# Patient Record
Sex: Male | Born: 1955 | Race: White | Hispanic: No | Marital: Married | Smoking: Never smoker
Health system: Southern US, Community
[De-identification: ages and names within clinical notes are randomized; demographics above are authoritative.]

## PROBLEM LIST (undated history)

## (undated) DIAGNOSIS — M51379 Other intervertebral disc degeneration, lumbosacral region without mention of lumbar back pain or lower extremity pain: Secondary | ICD-10-CM

## (undated) DIAGNOSIS — G4730 Sleep apnea, unspecified: Secondary | ICD-10-CM

## (undated) DIAGNOSIS — M5137 Other intervertebral disc degeneration, lumbosacral region: Secondary | ICD-10-CM

## (undated) DIAGNOSIS — E785 Hyperlipidemia, unspecified: Secondary | ICD-10-CM

## (undated) DIAGNOSIS — I10 Essential (primary) hypertension: Secondary | ICD-10-CM

## (undated) DIAGNOSIS — K760 Fatty (change of) liver, not elsewhere classified: Secondary | ICD-10-CM

## (undated) HISTORY — DX: Sleep apnea, unspecified: G47.30

## (undated) HISTORY — DX: Other intervertebral disc degeneration, lumbosacral region without mention of lumbar back pain or lower extremity pain: M51.379

## (undated) HISTORY — DX: Essential (primary) hypertension: I10

## (undated) HISTORY — DX: Morbid (severe) obesity due to excess calories: E66.01

## (undated) HISTORY — DX: Other intervertebral disc degeneration, lumbosacral region: M51.37

## (undated) HISTORY — DX: Fatty (change of) liver, not elsewhere classified: K76.0

## (undated) HISTORY — PX: OTHER SURGICAL HISTORY: SHX169

## (undated) HISTORY — DX: Hyperlipidemia, unspecified: E78.5

---

## 2016-11-30 ENCOUNTER — Encounter: Payer: Self-pay | Admitting: Family Medicine

## 2017-02-05 HISTORY — PX: SKIN CANCER EXCISION: SHX779

## 2017-07-11 ENCOUNTER — Encounter: Payer: Self-pay | Admitting: *Deleted

## 2017-09-13 ENCOUNTER — Ambulatory Visit: Payer: 59 | Admitting: Family Medicine

## 2017-10-14 ENCOUNTER — Encounter: Payer: Self-pay | Admitting: Family Medicine

## 2017-10-14 ENCOUNTER — Telehealth: Payer: Self-pay | Admitting: Family Medicine

## 2017-10-14 ENCOUNTER — Ambulatory Visit (INDEPENDENT_AMBULATORY_CARE_PROVIDER_SITE_OTHER): Payer: 59 | Admitting: Family Medicine

## 2017-10-14 VITALS — BP 164/100 | HR 98 | Temp 98.3°F | Resp 22 | Ht 73.0 in | Wt 362.0 lb

## 2017-10-14 DIAGNOSIS — Z0000 Encounter for general adult medical examination without abnormal findings: Secondary | ICD-10-CM | POA: Diagnosis not present

## 2017-10-14 DIAGNOSIS — I10 Essential (primary) hypertension: Secondary | ICD-10-CM

## 2017-10-14 DIAGNOSIS — Z1159 Encounter for screening for other viral diseases: Secondary | ICD-10-CM | POA: Diagnosis not present

## 2017-10-14 DIAGNOSIS — Z8249 Family history of ischemic heart disease and other diseases of the circulatory system: Secondary | ICD-10-CM

## 2017-10-14 DIAGNOSIS — G4730 Sleep apnea, unspecified: Secondary | ICD-10-CM

## 2017-10-14 DIAGNOSIS — Z1211 Encounter for screening for malignant neoplasm of colon: Secondary | ICD-10-CM

## 2017-10-14 DIAGNOSIS — Z125 Encounter for screening for malignant neoplasm of prostate: Secondary | ICD-10-CM | POA: Diagnosis not present

## 2017-10-14 DIAGNOSIS — Z7689 Persons encountering health services in other specified circumstances: Secondary | ICD-10-CM

## 2017-10-14 MED ORDER — LOSARTAN POTASSIUM-HCTZ 100-25 MG PO TABS: 1.0000 | ORAL_TABLET | Freq: Every day | ORAL | 3 refills | Status: DC

## 2017-10-14 NOTE — Progress Notes (Signed)
Subjective:    Patient ID: Aaron Duncan, male    DOB: 1955-07-30, 62 y.o.   MRN: [id number]  HPI Patient is a very pleasant 62 year old Caucasian male here today to establish care.  He has 2 major concerns.  First he would like a referral to gastroenterology for a colonoscopy.  He states that his last colonoscopy was when he was 62 years old.  However his brother was found to have 9, "precancerous" polyps on his last colonoscopy and therefore the patient is concerned and would like to have a follow-up colonoscopy.  He is certainly overdue given his age.  His second concern is he wants to be checked for a AAA.  His paternal grandfather had a AAA that required surgical correction.  His father almost died from a ruptured AAA.  Both of these events happened when they were in their 60s.  The patient is a non-smoker but given his family history, he is concerned that he may have a AAA that is going undiagnosed and therefore he would like an ultrasound to evaluate further.  I am concerned by his blood pressure.  His blood pressure today is 164/100.  Patient states that he is extremely nervous in the doctor's office and he feels that this is a fictitious reading.  He states that his blood pressure at home is usually 130-140/80.  He is overdue for hepatitis C screening.  He is overdue for prostate cancer screening.  He is overdue for fasting lab work.  Past medical history significant for obstructive sleep apnea.  He is uncertain of the severity.  This was checked over 20 years ago.  However he never started treatment for it.  He states that he has none of the symptoms of obstructive sleep apnea.  He denies any hypersomnolence.  He denies any fatigue.  He does state that his wife is heard him gasp for air in the middle the night.  On examination today, his neck circumference is greater than 20 inches.  He has a very short mandible.  On examination of his posterior oropharynx, I am unable to appreciate an airway.  He has a  very low palate and also a very large tongue that obscures visualization of the uvula in the posterior oropharynx.  Therefore I believe just based on his physical exam findings he is at high risk for severe obstructive sleep apnea. Past Medical History:  Diagnosis Date  . Hyperlipidemia   . Hypertension   . Morbid obesity (HCC)   . Sleep apnea    Past Surgical History:  Procedure Laterality Date  . right elbow tendonitis     work related injury   Current Outpatient Medications on File Prior to Visit  Medication Sig Dispense Refill  . Ginger, Zingiber officinalis, (GINGER PO) Take by mouth.    . magnesium 30 MG tablet Take 30 mg by mouth 2 (two) times daily.    . Multiple Vitamin (MULTIVITAMIN WITH MINERALS) TABS tablet Take 1 tablet by mouth daily.    . TURMERIC PO Take by mouth.    . vitamin B-12 (CYANOCOBALAMIN) 100 MCG tablet Take 100 mcg by mouth daily.     No current facility-administered medications on file prior to visit.    No Known Allergies Social History   Socioeconomic History  . Marital status: Married    Spouse name: Not on file  . Number of children: Not on file  . Years of education: Not on file  . Highest education level: Not on file  Occupational History  . Not on file  Social Needs  . Financial resource strain: Not on file  . Food insecurity:    Worry: Not on file    Inability: Not on file  . Transportation needs:    Medical: Not on file    Non-medical: Not on file  Tobacco Use  . Smoking status: Never Smoker  . Smokeless tobacco: Never Used  Substance and Sexual Activity  . Alcohol use: Yes    Alcohol/week: 2.0 standard drinks    Types: 2 Glasses of wine per week    Frequency: Never  . Drug use: Never  . Sexual activity: Not Currently  Lifestyle  . Physical activity:    Days per week: Not on file    Minutes per session: Not on file  . Stress: Not on file  Relationships  . Social connections:    Talks on phone: Not on file    Gets  together: Not on file    Attends religious service: Not on file    Active member of club or organization: Not on file    Attends meetings of clubs or organizations: Not on file    Relationship status: Not on file  . Intimate partner violence:    Fear of current or ex partner: Not on file    Emotionally abused: Not on file    Physically abused: Not on file    Forced sexual activity: Not on file  Other Topics Concern  . Not on file  Social History Narrative  . Not on file  Patient is a retired Financial controller Family History  Problem Relation Age of Onset  . Diabetes Mother   . AAA (abdominal aortic aneurysm) Father   . Cancer Father        lung  . Diabetes Maternal Uncle   . AAA (abdominal aortic aneurysm) Paternal Grandfather       Review of Systems  All other systems reviewed and are negative.      Objective:   Physical Exam  Constitutional: He is oriented to person, place, and time. He appears well-developed and well-nourished. No distress.  HENT:  Head: Normocephalic and atraumatic.  Right Ear: External ear normal.  Left Ear: External ear normal.  Nose: Nose normal.  Mouth/Throat: Oropharynx is clear and moist. No oropharyngeal exudate.  Eyes: Pupils are equal, round, and reactive to light. Conjunctivae and EOM are normal. Right eye exhibits no discharge. Left eye exhibits no discharge. No scleral icterus.  Neck: Normal range of motion. Neck supple. No JVD present. No tracheal deviation present. No thyromegaly present.  Cardiovascular: Normal rate, regular rhythm, normal heart sounds and intact distal pulses. Exam reveals no gallop and no friction rub.  No murmur heard. Pulmonary/Chest: Effort normal and breath sounds normal. No stridor. No respiratory distress. He has no wheezes. He has no rales. He exhibits no tenderness.  Abdominal: Soft. Bowel sounds are normal. He exhibits no distension and no mass. There is no tenderness. There is no rebound and no guarding. No  hernia.  Musculoskeletal: He exhibits no edema, tenderness or deformity.  Lymphadenopathy:    He has no cervical adenopathy.  Neurological: He is alert and oriented to person, place, and time. He displays normal reflexes. No cranial nerve deficit or sensory deficit. He exhibits normal muscle tone. Coordination normal.  Skin: Skin is warm. No rash noted. He is not diaphoretic. No erythema. No pallor.  Psychiatric: He has a normal mood and affect. His behavior is normal. Judgment  and thought content normal.  Vitals reviewed.         Assessment & Plan:  Encounter to establish care with new doctor - Plan: CBC with Differential/Platelet, COMPLETE METABOLIC PANEL WITH GFR, Lipid panel, PSA, Hepatitis C Antibody  Benign essential HTN - Plan: CBC with Differential/Platelet, COMPLETE METABOLIC PANEL WITH GFR, Lipid panel  Prostate cancer screening - Plan: PSA  Encounter for hepatitis C screening test for low risk patient - Plan: Hepatitis C Antibody  Sleep apnea, unspecified type  Colon cancer screening  Family history of abdominal aortic aneurysm (AAA)  Morbid obesity (HCC)  There is several issues that we discussed today.  First I will gladly schedule the patient for a colonoscopy to update his colon cancer screening.  In addition to this I will check a PSA to screen for prostate cancer as well as I will screen the patient for hepatitis C.  I am very concerned about his blood pressure.  Of asked the patient to bring by his blood pressure cuff so that we can ensure that it is accurate but checking it against ours.  I asked him to check his blood pressure more frequently at home and I would definitely treat his blood pressure if his blood pressures consistently greater than 140/90.  At the present time I will assume whitecoat syndrome until we have verified or refuted the accuracy of his blood pressure cuff.  I will check a CBC, CMP, and fasting lipid panel.  I will schedule patient for an  ultrasound of the abdomen to screen for AAA given his family history.  Mended a referral to a sleep specialist to screen the patient for obstructive sleep apnea is based on his physical exam findings, I am very concerned that he likely has that disease and that is being inadequately treated at the present time

## 2017-10-14 NOTE — Telephone Encounter (Signed)
Medication called/sent to requested pharmacy  

## 2017-10-14 NOTE — Telephone Encounter (Signed)
[facility]

## 2017-10-15 LAB — LIPID PANEL
Cholesterol: 197 mg/dL (ref <200)
HDL: 35 mg/dL — ABNORMAL LOW (ref >40)
LDL CHOLESTEROL (CALC): 135 mg/dL — AB
NON-HDL CHOLESTEROL (CALC): 162 mg/dL — AB (ref <130)
TRIGLYCERIDES: 141 mg/dL (ref <150)
Total CHOL/HDL Ratio: 5.6 (calc) — ABNORMAL HIGH (ref <5.0)

## 2017-10-15 LAB — CBC WITH DIFFERENTIAL/PLATELET
Basophils Absolute: 41 cells/uL (ref 0–200)
Basophils Relative: 0.5 %
EOS ABS: 178 {cells}/uL (ref 15–500)
Eosinophils Relative: 2.2 %
HCT: 51.5 % — ABNORMAL HIGH (ref 38.5–50.0)
Hemoglobin: 17.7 g/dL — ABNORMAL HIGH (ref 13.2–17.1)
Lymphs Abs: 2284 cells/uL (ref 850–3900)
MCH: 31.1 pg (ref 27.0–33.0)
MCHC: 34.4 g/dL (ref 32.0–36.0)
MCV: 90.4 fL (ref 80.0–100.0)
MPV: 11.1 fL (ref 7.5–12.5)
Monocytes Relative: 10.8 %
NEUTROS PCT: 58.3 %
Neutro Abs: 4722 cells/uL (ref 1500–7800)
PLATELETS: 234 10*3/uL (ref 140–400)
RBC: 5.7 10*6/uL (ref 4.20–5.80)
RDW: 13 % (ref 11.0–15.0)
TOTAL LYMPHOCYTE: 28.2 %
WBC: 8.1 10*3/uL (ref 3.8–10.8)
WBCMIX: 875 {cells}/uL (ref 200–950)

## 2017-10-15 LAB — COMPLETE METABOLIC PANEL WITH GFR
AG RATIO: 1.6 (calc) (ref 1.0–2.5)
ALT: 45 U/L (ref 9–46)
AST: 41 U/L — AB (ref 10–35)
Albumin: 4.5 g/dL (ref 3.6–5.1)
Alkaline phosphatase (APISO): 83 U/L (ref 40–115)
BILIRUBIN TOTAL: 0.8 mg/dL (ref 0.2–1.2)
BUN: 11 mg/dL (ref 7–25)
CHLORIDE: 101 mmol/L (ref 98–110)
CO2: 24 mmol/L (ref 20–32)
Calcium: 10.1 mg/dL (ref 8.6–10.3)
Creat: 1.11 mg/dL (ref 0.70–1.25)
GFR, EST AFRICAN AMERICAN: 82 mL/min/{1.73_m2} (ref >=60)
GFR, EST NON AFRICAN AMERICAN: 71 mL/min/{1.73_m2} (ref >=60)
Globulin: 2.8 g/dL (calc) (ref 1.9–3.7)
Glucose, Bld: 99 mg/dL (ref 65–99)
Potassium: 4.3 mmol/L (ref 3.5–5.3)
Sodium: 137 mmol/L (ref 135–146)
Total Protein: 7.3 g/dL (ref 6.1–8.1)

## 2017-10-15 LAB — HEPATITIS C ANTIBODY
Hepatitis C Ab: NONREACTIVE
SIGNAL TO CUT-OFF: 0.18 (ref <1.00)

## 2017-10-15 LAB — PSA: PSA: 1 ng/mL (ref <=4.0)

## 2017-10-18 ENCOUNTER — Other Ambulatory Visit: Payer: Self-pay | Admitting: Family Medicine

## 2017-10-18 MED ORDER — ATORVASTATIN CALCIUM 20 MG PO TABS: 20.0000 mg | ORAL_TABLET | Freq: Every day | ORAL | 1 refills | Status: DC

## 2017-10-23 ENCOUNTER — Ambulatory Visit
Admission: RE | Admit: 2017-10-23 | Discharge: 2017-10-23 | Disposition: A | Discharge status: Home / Self Care | Payer: 59 | Source: Ambulatory Visit | Attending: Family Medicine | Admitting: Family Medicine

## 2017-10-23 DIAGNOSIS — Z8249 Family history of ischemic heart disease and other diseases of the circulatory system: Secondary | ICD-10-CM

## 2017-10-24 ENCOUNTER — Other Ambulatory Visit: Payer: Self-pay | Admitting: Family Medicine

## 2017-10-24 DIAGNOSIS — Z8249 Family history of ischemic heart disease and other diseases of the circulatory system: Secondary | ICD-10-CM

## 2017-10-24 NOTE — Progress Notes (Signed)
CT ordered per Dr.Pickard. Please see result note for Ultrasound aorta complete

## 2017-10-31 ENCOUNTER — Encounter: Payer: Self-pay | Admitting: Gastroenterology

## 2017-10-31 ENCOUNTER — Ambulatory Visit
Admission: RE | Admit: 2017-10-31 | Discharge: 2017-10-31 | Disposition: A | Discharge status: Home / Self Care | Payer: 59 | Source: Ambulatory Visit | Attending: Family Medicine | Admitting: Family Medicine

## 2017-10-31 DIAGNOSIS — Z8249 Family history of ischemic heart disease and other diseases of the circulatory system: Secondary | ICD-10-CM

## 2017-11-04 ENCOUNTER — Encounter: Payer: Self-pay | Admitting: Family Medicine

## 2017-11-04 DIAGNOSIS — M5137 Other intervertebral disc degeneration, lumbosacral region: Secondary | ICD-10-CM | POA: Insufficient documentation

## 2017-11-05 ENCOUNTER — Encounter: Payer: Self-pay | Admitting: *Deleted

## 2017-11-26 ENCOUNTER — Telehealth: Payer: Self-pay

## 2017-11-26 ENCOUNTER — Other Ambulatory Visit: Payer: Self-pay

## 2017-11-26 DIAGNOSIS — Z1211 Encounter for screening for malignant neoplasm of colon: Secondary | ICD-10-CM

## 2017-11-26 NOTE — Telephone Encounter (Signed)
Aaron Duncan, Aaron Duncan is scheduled for PV on 12/02/17 and a colonoscopy with Dr. Lavon Paganini on 12/20/17. Patients weight is 362 and his BMI is 47.8. Dr. Lavon Paganini said that it is ok to schedule him for a direct hospital. He doesn't seem to have any other health issues. If his appointment is going to be several weeks away, we will need to reschedule PV closer to the time of the procedure. Thanks!  Janalee Dane, LPN ( PV )

## 2017-11-26 NOTE — Telephone Encounter (Signed)
Left message for Aaron Duncan to call back to me. His new colonoscopy date is 01/27/18 at 8:30 am. I have cancelled the [facility] and pre-visit appointments.

## 2017-11-26 NOTE — Telephone Encounter (Signed)
Dr. Lavon Paganini,   Aaron Duncan is a direct colon screening. His BMI is 47.8 and his weight is 362. Would you like for him to have an OV or a direct hospital colonoscopy? Please advise. Thanks!  Janalee Dane, LPN ( PV )

## 2017-11-26 NOTE — Telephone Encounter (Signed)
Okay to schedule as direct screening colonoscopy at hospital, next available appointment. Thanks

## 2017-11-28 NOTE — Telephone Encounter (Signed)
Left message on the spouse cell phone requesting call back.

## 2017-11-29 NOTE — Telephone Encounter (Signed)
  Contacted the spouse. Rescheduled the pre-visit. Agrees to the hospital procedure date of 01/27/18

## 2017-11-29 NOTE — Telephone Encounter (Signed)
The patient is now calling back stating he does not want the procedure during the week of Christmas and wants to reschedule that appt even if it has to be next year per pt.

## 2017-12-02 ENCOUNTER — Telehealth: Payer: Self-pay | Admitting: Gastroenterology

## 2017-12-02 NOTE — Telephone Encounter (Signed)
Left message for Ms. Hoefle. Colonoscopy at the [facility] and the pre-visit have been cancelled. Will put in for a recall after 02/05/2018

## 2017-12-03 NOTE — Telephone Encounter (Signed)
Called to the spouse. Left message that I will cancel the appointment for the colonoscopy as requested. Will reach out to him when the new year's schedule is released.

## 2017-12-10 ENCOUNTER — Encounter: Payer: 59 | Admitting: Gastroenterology

## 2017-12-24 ENCOUNTER — Institutional Professional Consult (permissible substitution): Payer: 59 | Admitting: Neurology

## 2018-01-27 ENCOUNTER — Encounter (HOSPITAL_COMMUNITY): Payer: Self-pay

## 2018-01-27 ENCOUNTER — Ambulatory Visit (HOSPITAL_COMMUNITY): Admit: 2018-01-27 | Payer: 59 | Admitting: Gastroenterology

## 2018-01-27 SURGERY — COLONOSCOPY WITH PROPOFOL
Anesthesia: Monitor Anesthesia Care

## 2018-02-27 NOTE — Telephone Encounter (Signed)
Pt's wife Melody called looking to schedule pt's colon at hosp. Pls call her.

## 2018-02-28 NOTE — Telephone Encounter (Signed)
Left message that I am looking for an opening on the hospital schedule. Presently there are not any openings. March schedule is not released. February schedule is filled.

## 2018-03-04 ENCOUNTER — Telehealth: Payer: Self-pay | Admitting: Gastroenterology

## 2018-03-04 NOTE — Telephone Encounter (Signed)
Pt wife called in and advised that the sched proc for feb.3 2020 can be released.

## 2018-03-04 NOTE — Telephone Encounter (Signed)
Left a message to call asap.  Opening for 03/10/2018 at [facility] for colonoscopy.  Would need pre-visit Thursday.

## 2018-03-05 NOTE — Telephone Encounter (Signed)
Called to the patient. He declines the appointment because he will be out of town. Willing to wait for thew March schedule.

## 2018-03-07 ENCOUNTER — Telehealth: Payer: Self-pay | Admitting: Gastroenterology

## 2018-03-07 NOTE — Telephone Encounter (Signed)
Case is cancelled. Left message for Onalee Hua.

## 2018-03-07 NOTE — Telephone Encounter (Signed)
Aaron Duncan from [employer] called and stated that the pt appt that is sched for 03/10/2018 is still showing on his end as non sched. He is wanting someone to follow up with him regarding the appt.

## 2018-03-10 ENCOUNTER — Encounter (HOSPITAL_COMMUNITY): Payer: Self-pay

## 2018-03-10 ENCOUNTER — Ambulatory Visit (HOSPITAL_COMMUNITY): Admit: 2018-03-10 | Payer: 59 | Admitting: Gastroenterology

## 2018-03-10 SURGERY — COLONOSCOPY WITH PROPOFOL
Anesthesia: Monitor Anesthesia Care

## 2018-03-18 ENCOUNTER — Encounter: Payer: Self-pay | Admitting: Gastroenterology

## 2018-04-16 ENCOUNTER — Telehealth: Payer: Self-pay | Admitting: Gastroenterology

## 2018-04-16 ENCOUNTER — Other Ambulatory Visit: Payer: Self-pay

## 2018-04-16 DIAGNOSIS — Z1211 Encounter for screening for malignant neoplasm of colon: Secondary | ICD-10-CM

## 2018-04-16 MED ORDER — SUPREP BOWEL PREP KIT 17.5-3.13-1.6 GM/177ML PO SOLN: ORAL | 0 refills | Status: DC

## 2018-04-16 NOTE — Telephone Encounter (Signed)
Scheduled for 05/26/18. Patient accepts this appointment. Instructions mailed. He will call with his questions or concerns. Rx mailed also.

## 2018-04-16 NOTE — Telephone Encounter (Signed)
Pt called to schedule colonoscopy at hospital.

## 2018-05-09 ENCOUNTER — Telehealth: Payer: Self-pay

## 2018-05-09 NOTE — Telephone Encounter (Signed)
Contacted the patient.  Explained the plan. Procedures canceled.

## 2018-05-09 NOTE — Telephone Encounter (Signed)
-----   Message from Napoleon Form, MD sent at 05/07/2018 11:50 AM EDT ----- Patient is scheduled at [facility] endo 4/20. Will need to reschedule for later.  He was originally scheduled last year, since then [facility] criteria have changed and no longer take only weight in consideration. If his BMI is below 50, he can be moved for [facility] with RN previsit.  Thanks VN

## 2018-05-26 ENCOUNTER — Ambulatory Visit (HOSPITAL_COMMUNITY): Admission: RE | Admit: 2018-05-26 | Payer: 59 | Source: Home / Self Care | Admitting: Gastroenterology

## 2018-05-26 ENCOUNTER — Encounter (HOSPITAL_COMMUNITY): Admission: RE | Payer: Self-pay | Source: Home / Self Care

## 2018-05-26 SURGERY — COLONOSCOPY WITH PROPOFOL
Anesthesia: Monitor Anesthesia Care

## 2018-06-12 ENCOUNTER — Other Ambulatory Visit: Payer: Self-pay

## 2018-06-12 DIAGNOSIS — Z1211 Encounter for screening for malignant neoplasm of colon: Secondary | ICD-10-CM

## 2018-06-25 ENCOUNTER — Telehealth: Payer: Self-pay | Admitting: *Deleted

## 2018-06-25 NOTE — Telephone Encounter (Signed)
McKew, Austin Miles, LPN  Inocente Salles, RN        Sure can!  Do I need to schedule the [facility] appointment or call the patient?   Previous Messages    ----- Message -----  From: Inocente Salles, RN  Sent: 06/25/2018  9:56 AM EDT  To: Evalee Jefferson, LPN   Hey,  Could you cancel this pt's appointment for 07-07-18 at [facility]?   Thanks,  Baxter Hire  ----- Message -----  From: Napoleon Form, MD  Sent: 06/25/2018  9:27 AM EDT  To: Inocente Salles, RN   Agree we can schedule him for [facility] with change in weight restriction  ----- Message -----  From: Inocente Salles, RN  Sent: 06/25/2018  8:49 AM EDT  To: Evalee Jefferson, LPN, Napoleon Form, MD   Hey,   I'm just getting this pt's chart ready for his PV on 07-01-18. Since his last recorded BMI is 47.7, could he be done at the [facility]? Is there any other reason he needs to be done at the hospital?   Baxter Hire

## 2018-07-01 ENCOUNTER — Encounter: Payer: Self-pay | Admitting: Gastroenterology

## 2018-07-01 ENCOUNTER — Other Ambulatory Visit: Payer: Self-pay

## 2018-07-01 ENCOUNTER — Ambulatory Visit (AMBULATORY_SURGERY_CENTER): Payer: Self-pay

## 2018-07-01 VITALS — Ht 73.0 in | Wt 362.0 lb

## 2018-07-01 DIAGNOSIS — Z1211 Encounter for screening for malignant neoplasm of colon: Secondary | ICD-10-CM

## 2018-07-01 MED ORDER — NA SULFATE-K SULFATE-MG SULF 17.5-3.13-1.6 GM/177ML PO SOLN: 1.0000 | Freq: Once | ORAL | 0 refills | Status: AC

## 2018-07-01 NOTE — Progress Notes (Signed)
Denies allergies to eggs or soy products. Denies complication of anesthesia or sedation. Denies use of weight loss medication. Denies use of O2.   Emmi instructions given for colonoscopy.  Pre-Visit was conducted by phone due to Covid 19. Instructions were reviewed and mailed to patients confirmed home address. Patient was encouraged to call if he had any questions or concerns.

## 2018-07-07 ENCOUNTER — Encounter (HOSPITAL_COMMUNITY): Payer: Self-pay

## 2018-07-07 ENCOUNTER — Ambulatory Visit (HOSPITAL_COMMUNITY): Admit: 2018-07-07 | Payer: 59 | Admitting: Gastroenterology

## 2018-07-07 ENCOUNTER — Telehealth: Payer: Self-pay

## 2018-07-07 SURGERY — COLONOSCOPY WITH PROPOFOL
Anesthesia: Monitor Anesthesia Care

## 2018-07-07 NOTE — Telephone Encounter (Signed)
Left message

## 2018-07-08 ENCOUNTER — Telehealth: Payer: Self-pay | Admitting: *Deleted

## 2018-07-08 NOTE — Telephone Encounter (Signed)
Covid-19 screening questions  Have you traveled in the last 14 days?no  If yes where?  Do you now or have you had a fever in the last 14 days?no  Do you have any respiratory symptoms of shortness of breath or cough now or in the last 14 days?no  Do you have any family members or close contacts with diagnosed or suspected Covid-19 in the past 14 days?no  Have you been tested for Covid-19 and found to be positive?no   Pt made aware of care partner policy. SM       

## 2018-07-09 ENCOUNTER — Ambulatory Visit (AMBULATORY_SURGERY_CENTER): Payer: 59 | Admitting: Gastroenterology

## 2018-07-09 ENCOUNTER — Encounter: Payer: Self-pay | Admitting: Gastroenterology

## 2018-07-09 ENCOUNTER — Other Ambulatory Visit: Payer: Self-pay

## 2018-07-09 VITALS — BP 144/76 | HR 74 | Temp 98.3°F | Resp 26 | Ht 73.0 in | Wt 362.0 lb

## 2018-07-09 DIAGNOSIS — Z538 Procedure and treatment not carried out for other reasons: Secondary | ICD-10-CM | POA: Diagnosis not present

## 2018-07-09 DIAGNOSIS — Z1211 Encounter for screening for malignant neoplasm of colon: Secondary | ICD-10-CM | POA: Diagnosis not present

## 2018-07-09 MED ORDER — SODIUM CHLORIDE 0.9 % IV SOLN: 500.0000 mL | Freq: Once | INTRAVENOUS | Status: DC

## 2018-07-09 NOTE — Progress Notes (Signed)
Dr Lavon Paganini asked me not to give anymore propofol after initial dose d/t bad prep and concerns over pt airway. Pt sats were 90-95% on 3L [place] pre procedure. He has significant obstruction with sedation that was overcome with jaw thrust but sats remained 88-90% on 5L.   To PACU, VSS Report to Rn.tb

## 2018-07-09 NOTE — Progress Notes (Signed)
History reviewed today  Temp and Covid 19 questions per C. Washington, VS per J. Branson 

## 2018-07-09 NOTE — Op Note (Signed)
[facility] Patient Name: Janean SarkDale Wenberg Procedure Date: 07/09/2018 10:10 AM MRN: [id number] Endoscopist: Napoleon FormKavitha V. Nandigam , MD Age: [age] Referring MD:  Date of Birth: 10/27/1955 Gender: Male Account #: [id number] Procedure:                Colonoscopy Indications:              Screening for colorectal malignant neoplasm Medicines:                Monitored Anesthesia Care Procedure:                Pre-Anesthesia Assessment:                           - Prior to the procedure, a History and Physical                            was performed, and patient medications and                            allergies were reviewed. The patient's tolerance of                            previous anesthesia was also reviewed. The risks                            and benefits of the procedure and the sedation                            options and risks were discussed with the patient.                            All questions were answered, and informed consent                            was obtained. Prior Anticoagulants: The patient has                            taken no previous anticoagulant or antiplatelet                            agents. ASA Grade Assessment: III - A patient with                            severe systemic disease. After reviewing the risks                            and benefits, the patient was deemed in                            satisfactory condition to undergo the procedure.                           After obtaining informed consent, the colonoscope  was passed under direct vision. Throughout the                            procedure, the patient's blood pressure, pulse, and                            oxygen saturations were monitored continuously. The                            Colonoscope was introduced through the anus and                            advanced to the the cecum, identified by                            appendiceal orifice and  ileocecal valve. The                            colonoscopy was performed without difficulty. The                            patient tolerated the procedure well. The quality                            of the bowel preparation was poor. The ileocecal                            valve, appendiceal orifice, and rectum were                            photographed. Scope In: 10:37:25 AM Scope Out: 10:44:27 AM Scope Withdrawal Time: 0 hours 2 minutes 42 seconds  Total Procedure Duration: 0 hours 7 minutes 2 seconds  Findings:                 The perianal and digital rectal examinations were                            normal.                           A moderate amount of semi-liquid semi-solid stool                            was found in the entire colon, interfering with                            visualization.                           Multiple small and large-mouthed diverticula were                            found in the sigmoid colon and descending colon.  Non-bleeding internal hemorrhoids were found during                            retroflexion. The hemorrhoids were medium-sized. Complications:            No immediate complications. Impression:               - Preparation of the colon was poor.                           - Stool in the entire examined colon.                           - Moderate diverticulosis in the sigmoid colon and                            in the descending colon.                           - Non-bleeding internal hemorrhoids.                           - No specimens collected. Recommendation:           - Patient has a contact number available for                            emergencies. The signs and symptoms of potential                            delayed complications were discussed with the                            patient. Return to normal activities tomorrow.                            Written discharge instructions were provided to  the                            patient.                           - Resume previous diet.                           - Continue present medications.                           - Repeat colonoscopy with extended bowel prep at                            the next available appointment because the bowel                            preparation was suboptimal. Will need to schedule  procedure at [facility] endoscopy unit , O2 sat in 80's on                            5L [place], OSA. Napoleon Form, MD 07/09/2018 10:51:10 AM This report has been signed electronically.

## 2018-07-09 NOTE — Patient Instructions (Signed)
Please read handouts provided. Continue present medications.     YOU HAD AN ENDOSCOPIC PROCEDURE TODAY AT THE [facility]:   Refer to the procedure report that was given to you for any specific questions about what was found during the examination.  If the procedure report does not answer your questions, please call your gastroenterologist to clarify.  If you requested that your care partner not be given the details of your procedure findings, then the procedure report has been included in a sealed envelope for you to review at your convenience later.  YOU SHOULD EXPECT: Some feelings of bloating in the abdomen. Passage of more gas than usual.  Walking can help get rid of the air that was put into your GI tract during the procedure and reduce the bloating. If you had a lower endoscopy (such as a colonoscopy or flexible sigmoidoscopy) you may notice spotting of blood in your stool or on the toilet paper. If you underwent a bowel prep for your procedure, you may not have a normal bowel movement for a few days.  Please Note:  You might notice some irritation and congestion in your nose or some drainage.  This is from the oxygen used during your procedure.  There is no need for concern and it should clear up in a day or so.  SYMPTOMS TO REPORT IMMEDIATELY:   Following lower endoscopy (colonoscopy or flexible sigmoidoscopy):  Excessive amounts of blood in the stool  Significant tenderness or worsening of abdominal pains  Swelling of the abdomen that is new, acute  Fever of 100F or higher   For urgent or emergent issues, a gastroenterologist can be reached at any hour by calling [phone].   DIET:  We do recommend a small meal at first, but then you may proceed to your regular diet.  Drink plenty of fluids but you should avoid alcoholic beverages for 24 hours.  ACTIVITY:  You should plan to take it easy for the rest of today and you should NOT DRIVE or use heavy machinery  until tomorrow (because of the sedation medicines used during the test).    FOLLOW UP: Our staff will call the number listed on your records 48-72 hours following your procedure to check on you and address any questions or concerns that you may have regarding the information given to you following your procedure. If we do not reach you, we will leave a message.  We will attempt to reach you two times.  During this call, we will ask if you have developed any symptoms of COVID 19. If you develop any symptoms (ie: fever, flu-like symptoms, shortness of breath, cough etc.) before then, please call [phone].  If you test positive for Covid 19 in the 2 weeks post procedure, please call and report this information to us.    If any biopsies were taken you will be contacted by phone or by letter within the next 1-3 weeks.  Please call us at [phone] if you have not heard about the biopsies in 3 weeks.    SIGNATURES/CONFIDENTIALITY: You and/or your care partner have signed paperwork which will be entered into your electronic medical record.  These signatures attest to the fact that that the information above on your After Visit Summary has been reviewed and is understood.  Full responsibility of the confidentiality of this discharge information lies with you and/or your care-partner. 

## 2018-07-11 ENCOUNTER — Telehealth: Payer: Self-pay

## 2018-07-11 NOTE — Telephone Encounter (Signed)
Attempted to reach patient for post-procedure f/u call. No answer. Left message that we will make another attempt to reach him again later today and for him to please not hesitate to call us if he has any questions/concerns regarding his care. 

## 2018-07-11 NOTE — Telephone Encounter (Signed)
Patient had sub-optimal prep for his colonoscopy on 07/09/18. He is aware of the recommendation for a repeat procedure. See the recommendation in the report. Called the patient to discuss scheduling. He states he chooses to wait until after the face mask requirement has been lifted. He specifically declines the offer of 08/27/2018 at [facility]. States he will call us as soon as he is ready and comfortable with having the procedure repeated.

## 2018-07-11 NOTE — Telephone Encounter (Signed)
  Follow up Call-  Call back number 07/09/2018  Post procedure Call Back phone  # [phone]  Permission to leave phone message Yes     Patient questions:  Do you have a fever, pain , or abdominal swelling? No. Pain Score  0 *  Have you tolerated food without any problems? Yes.    Have you been able to return to your normal activities? Yes.    Do you have any questions about your discharge instructions: Diet   No. Medications  No. Follow up visit  No.  Do you have questions or concerns about your Care? No.  Actions: * If pain score is 4 or above: No action needed, pain <4.  1. Have you developed a fever since your procedure? no  2.   Have you had an respiratory symptoms (SOB or cough) since your procedure? no  3.   Have you tested positive for COVID 19 since your procedure no  4.   Have you had any family members/close contacts diagnosed with the COVID 19 since your procedure?  no   If yes to any of these questions please route to Laverna Peace, RN and Jennye Boroughs, Charity fundraiser.

## 2018-08-27 ENCOUNTER — Ambulatory Visit (HOSPITAL_COMMUNITY): Admit: 2018-08-27 | Payer: 59 | Admitting: Gastroenterology

## 2018-08-27 ENCOUNTER — Encounter (HOSPITAL_COMMUNITY): Payer: Self-pay

## 2018-08-27 SURGERY — COLONOSCOPY WITH PROPOFOL
Anesthesia: Monitor Anesthesia Care

## 2018-09-02 ENCOUNTER — Other Ambulatory Visit: Payer: Self-pay | Admitting: Family Medicine

## 2018-09-27 ENCOUNTER — Encounter: Payer: Self-pay | Admitting: Gastroenterology

## 2019-02-26 ENCOUNTER — Other Ambulatory Visit: Payer: Self-pay | Admitting: Family Medicine

## 2019-04-21 ENCOUNTER — Other Ambulatory Visit: Payer: Self-pay | Admitting: Family Medicine

## 2019-05-14 ENCOUNTER — Other Ambulatory Visit: Payer: Self-pay | Admitting: Family Medicine

## 2019-05-14 MED ORDER — LOSARTAN POTASSIUM-HCTZ 100-25 MG PO TABS: ORAL_TABLET | ORAL | 11 refills | Status: DC

## 2019-07-03 ENCOUNTER — Telehealth: Payer: Self-pay | Admitting: Family Medicine

## 2019-07-03 NOTE — Telephone Encounter (Signed)
#  CB [id number] Would like to have Losartan-hydrochlorthiazide supply order for 90 days instead of  30 days when time for a refill

## 2020-03-24 ENCOUNTER — Telehealth: Payer: Self-pay | Admitting: *Deleted

## 2020-03-24 NOTE — Telephone Encounter (Signed)
Received VM from patient.   Requested refill on Losartan HCTZ to mail order.   Patient has not been seen since 2019 and will require OV.   Call placed to patient. LMTRC.

## 2020-03-28 MED ORDER — LOSARTAN POTASSIUM-HCTZ 100-25 MG PO TABS: ORAL_TABLET | ORAL | 0 refills | Status: DC

## 2020-03-28 NOTE — Telephone Encounter (Signed)
Call placed to patient and patient made aware.   Appointment scheduled.   Prescription sent to pharmacy.  

## 2020-04-18 ENCOUNTER — Encounter: Payer: 59 | Admitting: Family Medicine

## 2020-05-17 ENCOUNTER — Encounter: Payer: 59 | Admitting: Family Medicine

## 2020-05-30 ENCOUNTER — Other Ambulatory Visit: Payer: 59

## 2020-06-02 ENCOUNTER — Encounter: Payer: Self-pay | Admitting: Family Medicine

## 2020-06-02 ENCOUNTER — Ambulatory Visit (INDEPENDENT_AMBULATORY_CARE_PROVIDER_SITE_OTHER): Payer: 59 | Admitting: Family Medicine

## 2020-06-02 ENCOUNTER — Other Ambulatory Visit: Payer: Self-pay

## 2020-06-02 DIAGNOSIS — Z0001 Encounter for general adult medical examination with abnormal findings: Secondary | ICD-10-CM | POA: Diagnosis not present

## 2020-06-02 DIAGNOSIS — Z125 Encounter for screening for malignant neoplasm of prostate: Secondary | ICD-10-CM

## 2020-06-02 DIAGNOSIS — E7800 Pure hypercholesterolemia, unspecified: Secondary | ICD-10-CM

## 2020-06-02 DIAGNOSIS — Z0000 Encounter for general adult medical examination without abnormal findings: Secondary | ICD-10-CM

## 2020-06-02 DIAGNOSIS — M5137 Other intervertebral disc degeneration, lumbosacral region: Secondary | ICD-10-CM | POA: Diagnosis not present

## 2020-06-02 DIAGNOSIS — G4730 Sleep apnea, unspecified: Secondary | ICD-10-CM | POA: Diagnosis not present

## 2020-06-02 DIAGNOSIS — K760 Fatty (change of) liver, not elsewhere classified: Secondary | ICD-10-CM

## 2020-06-02 NOTE — Progress Notes (Signed)
Subjective:    Patient ID: Aaron Duncan, male    DOB: [birth year], 65 y.o.   MRN: [id number]  HPI  Patient is here today for a complete physical exam.  Patient has morbid obesity with a BMI of 47.  I reviewed his colonoscopy from 2020.  They were unable to get a good view of the colon due to inadequate prep.  Of note, his oxygen saturations were in the 80s on 5 L.  He is most likely dealing with severe obstructive sleep apnea.  He tends to minimize this and wants to avoid any intervention.  He mentions losing weight which I certainly encouraged however I explained to the patient that this will take a long period of time and his blood pressure is extremely high today and I feel that he is likely dealing with severe obstructive sleep apnea.  The patient tends to minimize the seriousness of this issue.  He also has chronic venous stasis changes on both legs with numerous varicose veins per tickly worse on the left shin.  Past Medical History:  Diagnosis Date  . DDD (degenerative disc disease), lumbosacral   . Hepatic steatosis   . Hyperlipidemia   . Hypertension   . Morbid obesity (HCC)   . Sleep apnea    Past Surgical History:  Procedure Laterality Date  . right elbow tendonitis     work related injury  . SKIN CANCER EXCISION Right 2019   basal cell ca R side of face   Current Outpatient Medications on File Prior to Visit  Medication Sig Dispense Refill  . atorvastatin (LIPITOR) 20 MG tablet Take 1 tablet (20 mg total) by mouth daily. 90 tablet 1  . Ginger, Zingiber officinalis, (GINGER PO) Take by mouth.    . losartan-hydrochlorothiazide (HYZAAR) 100-25 MG tablet TAKE 1 TABLET DAILY (NEED OFFICE VISIT AND LABS FOR FURTHER REFILLS) 90 tablet 0  . magnesium 30 MG tablet Take 30 mg by mouth 2 (two) times daily.    . Multiple Vitamin (MULTIVITAMIN WITH MINERALS) TABS tablet Take 1 tablet by mouth daily.    . TURMERIC PO Take by mouth.    . vitamin B-12 (CYANOCOBALAMIN) 100 MCG tablet Take 100  mcg by mouth daily.     No current facility-administered medications on file prior to visit.   No Known Allergies Social History   Socioeconomic History  . Marital status: Married    Spouse name: Not on file  . Number of children: Not on file  . Years of education: Not on file  . Highest education level: Not on file  Occupational History  . Not on file  Tobacco Use  . Smoking status: Never Smoker  . Smokeless tobacco: Never Used  Vaping Use  . Vaping Use: Never used  Substance and Sexual Activity  . Alcohol use: Yes    Alcohol/week: 2.0 standard drinks    Types: 2 Glasses of wine per week  . Drug use: Never  . Sexual activity: Not Currently  Other Topics Concern  . Not on file  Social History Narrative  . Not on file   Social Determinants of Health   Financial Resource Strain: Not on file  Food Insecurity: Not on file  Transportation Needs: Not on file  Physical Activity: Not on file  Stress: Not on file  Social Connections: Not on file  Intimate Partner Violence: Not on file  Patient is a retired Financial controller Family History  Problem Relation Age of Onset  . Diabetes Mother   .  AAA (abdominal aortic aneurysm) Father   . Cancer Father        lung  . Diabetes Maternal Uncle   . AAA (abdominal aortic aneurysm) Paternal Grandfather   . Colon polyps Brother   . Colon cancer Neg Hx   . Esophageal cancer Neg Hx   . Rectal cancer Neg Hx   . Stomach cancer Neg Hx       Review of Systems  All other systems reviewed and are negative.      Objective:   Physical Exam Vitals reviewed.  Constitutional:      General: He is not in acute distress.    Appearance: He is well-developed. He is not diaphoretic.  HENT:     Head: Normocephalic and atraumatic.     Right Ear: External ear normal.     Left Ear: External ear normal.     Nose: Nose normal.     Mouth/Throat:     Pharynx: No oropharyngeal exudate.  Eyes:     General: No scleral icterus.       Right  eye: No discharge.        Left eye: No discharge.     Conjunctiva/sclera: Conjunctivae normal.     Pupils: Pupils are equal, round, and reactive to light.  Neck:     Thyroid: No thyromegaly.     Vascular: No JVD.     Trachea: No tracheal deviation.  Cardiovascular:     Rate and Rhythm: Normal rate and regular rhythm.     Heart sounds: Normal heart sounds. No murmur heard. No friction rub. No gallop.   Pulmonary:     Effort: Pulmonary effort is normal. No respiratory distress.     Breath sounds: Normal breath sounds. No stridor. No wheezing or rales.  Chest:     Chest wall: No tenderness.  Abdominal:     General: Bowel sounds are normal. There is no distension.     Palpations: Abdomen is soft. There is no mass.     Tenderness: There is no abdominal tenderness. There is no guarding or rebound.     Hernia: No hernia is present.  Musculoskeletal:        General: No tenderness or deformity.     Cervical back: Normal range of motion and neck supple.  Lymphadenopathy:     Cervical: No cervical adenopathy.  Skin:    General: Skin is warm.     Coloration: Skin is not pale.     Findings: No erythema or rash.  Neurological:     Mental Status: He is alert and oriented to person, place, and time.     Cranial Nerves: No cranial nerve deficit.     Sensory: No sensory deficit.     Motor: No abnormal muscle tone.     Coordination: Coordination normal.     Deep Tendon Reflexes: Reflexes normal.  Psychiatric:        Behavior: Behavior normal.        Thought Content: Thought content normal.        Judgment: Judgment normal.     Chronic venous stasis changes on both legs left greater than right with varicose veins and trace pitting edema      Assessment & Plan:  Morbid obesity (HCC) - Plan: CBC with Differential/Platelet, COMPLETE METABOLIC PANEL WITH GFR, Lipid panel, PSA  Sleep apnea, unspecified type - Plan: CBC with Differential/Platelet, COMPLETE METABOLIC PANEL WITH GFR, Lipid  panel, PSA  DDD (degenerative disc disease), lumbosacral  Hepatic  steatosis - Plan: CBC with Differential/Platelet, COMPLETE METABOLIC PANEL WITH GFR, Lipid panel, PSA  Pure hypercholesterolemia - Plan: CBC with Differential/Platelet, COMPLETE METABOLIC PANEL WITH GFR, Lipid panel, PSA  General medical exam - Plan: CBC with Differential/Platelet, COMPLETE METABOLIC PANEL WITH GFR, Lipid panel, PSA  Prostate cancer screening - Plan: PSA  Patient would benefit from a sleep study.  I feel certain that he has obstructive sleep apnea that could eventually lead to underlying cardiovascular disease.  Therefore I insisted on a sleep study.  Given the swelling in his legs and the venous stasis changes and his blood pressure, I will get an echocardiogram to evaluate for left ventricular hypertrophy and diastolic dysfunction.  Check CBC, CMP, lipid panel.  Check PSA.  Strongly encourage diet exercise and weight loss.  Repeat blood pressure and if consistently greater then 140/90 would recommend medication to lower blood pressure

## 2020-06-06 LAB — CBC WITH DIFFERENTIAL/PLATELET
Absolute Monocytes: 905 cells/uL (ref 200–950)
Basophils Absolute: 87 cells/uL (ref 0–200)
Basophils Relative: 1 %
Eosinophils Absolute: 287 cells/uL (ref 15–500)
Eosinophils Relative: 3.3 %
HCT: 51.2 % — ABNORMAL HIGH (ref 38.5–50.0)
Hemoglobin: 17 g/dL (ref 13.2–17.1)
Lymphs Abs: 2706 cells/uL (ref 850–3900)
MCH: 31 pg (ref 27.0–33.0)
MCHC: 33.2 g/dL (ref 32.0–36.0)
MCV: 93.4 fL (ref 80.0–100.0)
MPV: 10.3 fL (ref 7.5–12.5)
Monocytes Relative: 10.4 %
Neutro Abs: 4715 cells/uL (ref 1500–7800)
Neutrophils Relative %: 54.2 %
Platelets: 300 10*3/uL (ref 140–400)
RBC: 5.48 10*6/uL (ref 4.20–5.80)
RDW: 12.3 % (ref 11.0–15.0)
Total Lymphocyte: 31.1 %
WBC: 8.7 10*3/uL (ref 3.8–10.8)

## 2020-06-06 LAB — COMPLETE METABOLIC PANEL WITH GFR
AG Ratio: 1.4 (calc) (ref 1.0–2.5)
ALT: 51 U/L — ABNORMAL HIGH (ref 9–46)
AST: 39 U/L — ABNORMAL HIGH (ref 10–35)
Albumin: 4.2 g/dL (ref 3.6–5.1)
Alkaline phosphatase (APISO): 87 U/L (ref 35–144)
BUN: 15 mg/dL (ref 7–25)
CO2: 26 mmol/L (ref 20–32)
Calcium: 9.7 mg/dL (ref 8.6–10.3)
Chloride: 101 mmol/L (ref 98–110)
Creat: 0.84 mg/dL (ref 0.70–1.25)
GFR, Est African American: 106 mL/min/{1.73_m2} (ref >=60)
GFR, Est Non African American: 92 mL/min/{1.73_m2} (ref >=60)
Globulin: 2.9 g/dL (calc) (ref 1.9–3.7)
Glucose, Bld: 151 mg/dL — ABNORMAL HIGH (ref 65–99)
Potassium: 4.1 mmol/L (ref 3.5–5.3)
Sodium: 138 mmol/L (ref 135–146)
Total Bilirubin: 0.7 mg/dL (ref 0.2–1.2)
Total Protein: 7.1 g/dL (ref 6.1–8.1)

## 2020-06-06 LAB — LIPID PANEL
Cholesterol: 182 mg/dL (ref <200)
HDL: 37 mg/dL — ABNORMAL LOW (ref >=40)
LDL Cholesterol (Calc): 123 mg/dL (calc) — ABNORMAL HIGH
Non-HDL Cholesterol (Calc): 145 mg/dL (calc) — ABNORMAL HIGH (ref <130)
Total CHOL/HDL Ratio: 4.9 (calc) (ref <5.0)
Triglycerides: 111 mg/dL (ref <150)

## 2020-06-06 LAB — PSA: PSA: 0.83 ng/mL (ref <=4.00)

## 2020-06-06 LAB — TEST AUTHORIZATION

## 2020-06-06 LAB — HEMOGLOBIN A1C W/OUT EAG: Hgb A1c MFr Bld: 8.4 % of total Hgb — ABNORMAL HIGH (ref <5.7)

## 2020-06-10 ENCOUNTER — Encounter: Payer: Self-pay | Admitting: *Deleted

## 2020-06-14 ENCOUNTER — Ambulatory Visit: Payer: MEDICARE | Admitting: Family Medicine

## 2020-06-17 ENCOUNTER — Other Ambulatory Visit: Payer: Self-pay | Admitting: Family Medicine

## 2020-06-21 ENCOUNTER — Ambulatory Visit: Payer: MEDICARE | Admitting: Family Medicine

## 2020-06-23 ENCOUNTER — Encounter: Payer: Self-pay | Admitting: Family Medicine

## 2020-06-23 ENCOUNTER — Other Ambulatory Visit: Payer: Self-pay

## 2020-06-23 ENCOUNTER — Ambulatory Visit (INDEPENDENT_AMBULATORY_CARE_PROVIDER_SITE_OTHER): Payer: MEDICARE | Admitting: Family Medicine

## 2020-06-23 VITALS — BP 148/82 | HR 94 | Temp 98.4°F | Resp 16 | Ht 73.0 in | Wt 365.0 lb

## 2020-06-23 DIAGNOSIS — E7800 Pure hypercholesterolemia, unspecified: Secondary | ICD-10-CM

## 2020-06-23 DIAGNOSIS — I10 Essential (primary) hypertension: Secondary | ICD-10-CM

## 2020-06-23 DIAGNOSIS — E119 Type 2 diabetes mellitus without complications: Secondary | ICD-10-CM

## 2020-06-23 MED ORDER — AMLODIPINE BESYLATE 5 MG PO TABS: 5.0000 mg | ORAL_TABLET | Freq: Every day | ORAL | 3 refills | Status: DC

## 2020-06-23 MED ORDER — METFORMIN HCL ER 500 MG PO TB24: 1000.0000 mg | ORAL_TABLET | Freq: Every day | ORAL | 5 refills | Status: DC

## 2020-06-24 NOTE — Progress Notes (Signed)
Subjective:    Patient ID: Aaron Duncan, male    DOB: 04/21/1955, 65 y.o.   MRN: [id number]  HPI  Office Visit on 06/02/2020  Component Date Value Ref Range Status  . WBC 06/02/2020 8.7  3.8 - 10.8 Thousand/uL Final  . RBC 06/02/2020 5.48  4.20 - 5.80 Million/uL Final  . Hemoglobin 06/02/2020 17.0  13.2 - 17.1 g/dL Final  . HCT 64/68/0321 51.2* 38.5 - 50.0 % Final  . MCV 06/02/2020 93.4  80.0 - 100.0 fL Final  . MCH 06/02/2020 31.0  27.0 - 33.0 pg Final  . MCHC 06/02/2020 33.2  32.0 - 36.0 g/dL Final  . RDW 22/48/2500 12.3  11.0 - 15.0 % Final  . Platelets 06/02/2020 300  140 - 400 Thousand/uL Final  . MPV 06/02/2020 10.3  7.5 - 12.5 fL Final  . Neutro Abs 06/02/2020 4,715  1,500 - 7,800 cells/uL Final  . Lymphs Abs 06/02/2020 2,706  850 - 3,900 cells/uL Final  . Absolute Monocytes 06/02/2020 905  200 - 950 cells/uL Final  . Eosinophils Absolute 06/02/2020 287  15 - 500 cells/uL Final  . Basophils Absolute 06/02/2020 87  0 - 200 cells/uL Final  . Neutrophils Relative % 06/02/2020 54.2  % Final  . Total Lymphocyte 06/02/2020 31.1  % Final  . Monocytes Relative 06/02/2020 10.4  % Final  . Eosinophils Relative 06/02/2020 3.3  % Final  . Basophils Relative 06/02/2020 1.0  % Final  . Glucose, Bld 06/02/2020 151* 65 - 99 mg/dL Final   Comment: .            Fasting reference interval . For someone without known diabetes, a glucose value >125 mg/dL indicates that they may have diabetes and this should be confirmed with a follow-up test. .   . BUN 06/02/2020 15  7 - 25 mg/dL Final  . Creat 37/05/8887 0.84  0.70 - 1.25 mg/dL Final   Comment: For patients >41 years of age, the reference limit for Creatinine is approximately 13% higher for people identified as African-American. .   . GFR, Est Non African American 06/02/2020 92  > OR = 60 mL/min/1.2m2 Final  . GFR, Est African American 06/02/2020 106  > OR = 60 mL/min/1.43m2 Final  . BUN/Creatinine Ratio 06/02/2020 NOT APPLICABLE  6  - 22 (calc) Final  . Sodium 06/02/2020 138  135 - 146 mmol/L Final  . Potassium 06/02/2020 4.1  3.5 - 5.3 mmol/L Final  . Chloride 06/02/2020 101  98 - 110 mmol/L Final  . CO2 06/02/2020 26  20 - 32 mmol/L Final  . Calcium 06/02/2020 9.7  8.6 - 10.3 mg/dL Final  . Total Protein 06/02/2020 7.1  6.1 - 8.1 g/dL Final  . Albumin 16/94/5038 4.2  3.6 - 5.1 g/dL Final  . Globulin 88/28/0034 2.9  1.9 - 3.7 g/dL (calc) Final  . AG Ratio 06/02/2020 1.4  1.0 - 2.5 (calc) Final  . Total Bilirubin 06/02/2020 0.7  0.2 - 1.2 mg/dL Final  . Alkaline phosphatase (APISO) 06/02/2020 87  35 - 144 U/L Final  . AST 06/02/2020 39* 10 - 35 U/L Final  . ALT 06/02/2020 51* 9 - 46 U/L Final  . Cholesterol 06/02/2020 182  <200 mg/dL Final  . HDL 91/79/1505 37* > OR = 40 mg/dL Final  . Triglycerides 06/02/2020 111  <150 mg/dL Final  . LDL Cholesterol (Calc) 06/02/2020 123* mg/dL (calc) Final   Comment: Reference range: <100 . Desirable range <100 mg/dL for primary prevention;   <  70 mg/dL for patients with CHD or diabetic patients  with > or = 2 CHD risk factors. Marland Kitchen. LDL-C is now calculated using the Martin-Hopkins  calculation, which is a validated novel method providing  better accuracy than the Friedewald equation in the  estimation of LDL-C.  Horald PollenMartin SS et al. Lenox AhrJAMA. 8563;149(702013;310(19): 2061-2068  (http://education.[facility].com/faq/FAQ164)   . Total CHOL/HDL Ratio 06/02/2020 4.9  <2.6<5.0 (calc) Final  . Non-HDL Cholesterol (Calc) 06/02/2020 145* <130 mg/dL (calc) Final   Comment: For patients with diabetes plus 1 major ASCVD risk  factor, treating to a non-HDL-C goal of <100 mg/dL  (LDL-C of <37<70 mg/dL) is considered a therapeutic  option.   Marland Kitchen. PSA 06/02/2020 0.83  < OR = 4.00 ng/mL Final   Comment: The total PSA value from this assay system is  standardized against the WHO standard. The test  result will be approximately 20% lower when compared  to the equimolar-standardized total PSA (Beckman  Coulter).  Comparison of serial PSA results should be  interpreted with this fact in mind. . This test was performed using the Siemens  chemiluminescent method. Values obtained from  different assay methods cannot be used interchangeably. PSA levels, regardless of value, should not be interpreted as absolute evidence of the presence or absence of disease.   . Hgb A1c MFr Bld 06/02/2020 8.4* <5.7 % of total Hgb Final   Comment: For someone without known diabetes, a hemoglobin A1c value of 6.5% or greater indicates that they may have  diabetes and this should be confirmed with a follow-up  test. . For someone with known diabetes, a value <7% indicates  that their diabetes is well controlled and a value  greater than or equal to 7% indicates suboptimal  control. A1c targets should be individualized based on  duration of diabetes, age, comorbid conditions, and  other considerations. . Currently, no consensus exists regarding use of hemoglobin A1c for diagnosis of diabetes for children. .   . TEST NAME: 06/02/2020 HEMOGLOBIN A1c   Final  . TEST CODE: 06/02/2020 [id number]   Final  . CLIENT CONTACT: 06/02/2020 Dwana CurdKIM WILES   Final  . REPORT ALWAYS MESSAGE SIGNATURE 06/02/2020    Final   Comment: . The laboratory testing on this patient was verbally requested or confirmed by the ordering physician or his or her authorized representative after contact with an employee of [facility]. Federal regulations require that we maintain on file written authorization for all laboratory testing.  Accordingly we are asking that the ordering physician or his or her authorized representative sign a copy of this report and promptly return it to the client service representative. . . Signature:____________________________________________________ . Please fax this signed page to [phone] or return it via your [facility] courier.    Patient is here today to discuss lab work.  A1c was elevated  at 8.4.  Cholesterol was also elevated with an LDL cholesterol well above 100.  Blood pressure here is still elevated at 148/82.  Patient is very hesitant to add any medication.  He wants to try to make lifestyle changes to address these abnormalities.  We had a long discussion today regarding a low carbohydrate diet, exercise, weight loss.  However I was also realistic with the patient about his increased cardiovascular risk.  I explained to him my concerns regarding not treating his blood pressure and blood sugar more aggressively. Past Medical History:  Diagnosis Date  . DDD (degenerative disc disease), lumbosacral   . Hepatic steatosis   .  Hyperlipidemia   . Hypertension   . Morbid obesity (HCC)   . Sleep apnea    Past Surgical History:  Procedure Laterality Date  . right elbow tendonitis     work related injury  . SKIN CANCER EXCISION Right 2019   basal cell ca R side of face   Current Outpatient Medications on File Prior to Visit  Medication Sig Dispense Refill  . Ginger, Zingiber officinalis, (GINGER PO) Take by mouth.    . losartan-hydrochlorothiazide (HYZAAR) 100-25 MG tablet TAKE 1 TABLET DAILY 90 tablet 3  . magnesium 30 MG tablet Take 30 mg by mouth 2 (two) times daily.    . Multiple Vitamin (MULTIVITAMIN WITH MINERALS) TABS tablet Take 1 tablet by mouth daily.    Marland Kitchen OVER THE COUNTER MEDICATION CBD Oil    . TURMERIC PO Take by mouth.    . vitamin B-12 (CYANOCOBALAMIN) 100 MCG tablet Take 100 mcg by mouth daily.     No current facility-administered medications on file prior to visit.   No Known Allergies Social History   Socioeconomic History  . Marital status: Married    Spouse name: Not on file  . Number of children: Not on file  . Years of education: Not on file  . Highest education level: Not on file  Occupational History  . Not on file  Tobacco Use  . Smoking status: Never Smoker  . Smokeless tobacco: Never Used  Vaping Use  . Vaping Use: Never used   Substance and Sexual Activity  . Alcohol use: Yes    Alcohol/week: 2.0 standard drinks    Types: 2 Glasses of wine per week  . Drug use: Never  . Sexual activity: Not Currently  Other Topics Concern  . Not on file  Social History Narrative  . Not on file   Social Determinants of Health   Financial Resource Strain: Not on file  Food Insecurity: Not on file  Transportation Needs: Not on file  Physical Activity: Not on file  Stress: Not on file  Social Connections: Not on file  Intimate Partner Violence: Not on file  Patient is a retired Financial controller Family History  Problem Relation Age of Onset  . Diabetes Mother   . AAA (abdominal aortic aneurysm) Father   . Cancer Father        lung  . Diabetes Maternal Uncle   . AAA (abdominal aortic aneurysm) Paternal Grandfather   . Colon polyps Brother   . Colon cancer Neg Hx   . Esophageal cancer Neg Hx   . Rectal cancer Neg Hx   . Stomach cancer Neg Hx       Review of Systems  All other systems reviewed and are negative.      Objective:   Physical Exam Vitals reviewed.  Constitutional:      General: He is not in acute distress.    Appearance: He is well-developed. He is not diaphoretic.  HENT:     Head: Normocephalic and atraumatic.     Right Ear: External ear normal.     Left Ear: External ear normal.     Nose: Nose normal.     Mouth/Throat:     Pharynx: No oropharyngeal exudate.  Eyes:     General: No scleral icterus.       Right eye: No discharge.        Left eye: No discharge.     Conjunctiva/sclera: Conjunctivae normal.     Pupils: Pupils are equal, round, and  reactive to light.  Neck:     Thyroid: No thyromegaly.     Vascular: No JVD.     Trachea: No tracheal deviation.  Cardiovascular:     Rate and Rhythm: Normal rate and regular rhythm.     Heart sounds: Normal heart sounds. No murmur heard. No friction rub. No gallop.   Pulmonary:     Effort: Pulmonary effort is normal. No respiratory  distress.     Breath sounds: Normal breath sounds. No stridor. No wheezing or rales.  Chest:     Chest wall: No tenderness.  Abdominal:     General: Bowel sounds are normal. There is no distension.     Palpations: Abdomen is soft. There is no mass.     Tenderness: There is no abdominal tenderness. There is no guarding or rebound.     Hernia: No hernia is present.  Musculoskeletal:        General: No tenderness or deformity.     Cervical back: Normal range of motion and neck supple.  Lymphadenopathy:     Cervical: No cervical adenopathy.  Skin:    General: Skin is warm.     Coloration: Skin is not pale.     Findings: No erythema or rash.  Neurological:     Mental Status: He is alert and oriented to person, place, and time.     Cranial Nerves: No cranial nerve deficit.     Sensory: No sensory deficit.     Motor: No abnormal muscle tone.     Coordination: Coordination normal.     Deep Tendon Reflexes: Reflexes normal.  Psychiatric:        Behavior: Behavior normal.        Thought Content: Thought content normal.        Judgment: Judgment normal.     Chronic venous stasis changes on both legs left greater than right with varicose veins and trace pitting edema      Assessment & Plan:  New onset type 2 diabetes mellitus (HCC)  Pure hypercholesterolemia  Morbid obesity (HCC)  Benign essential HTN  I respect the fact that the patient does not want to add additional medication however also explained to him that he is at increased risk of cardiovascular complications if we do not aggressively treat these conditions.  Therefore, I have recommended 50 pounds of weight loss over the next 3 to 6 months.  I have recommended a low carbohydrate diet.  I want him to try to consume less than 45 g of carbohydrates per meal.  We discussed how to determine the amount of carbohydrates and food and what foods he needs to try to avoid and what foods he can eat more liberally.  In addition I want  to start the patient on metformin extended release 1000 mg p.o. every morning.  He is hesitant to add medication but he is willing to concede to at least take the metformin.  Also want to more aggressively treat his blood pressure and we will add amlodipine 5 mg a day and monitor for any swelling.  Repeat lab work in 3 months.  If significant improvement is not being seen in his sugars and cholesterol at that time I would try to convince the patient to add a statin and uptitrate his diabetes medication.

## 2020-07-12 ENCOUNTER — Other Ambulatory Visit: Payer: Self-pay

## 2020-07-12 ENCOUNTER — Ambulatory Visit (HOSPITAL_COMMUNITY): Payer: MEDICARE | Attending: Cardiology

## 2020-07-12 DIAGNOSIS — R0600 Dyspnea, unspecified: Secondary | ICD-10-CM | POA: Diagnosis not present

## 2020-07-12 DIAGNOSIS — G4730 Sleep apnea, unspecified: Secondary | ICD-10-CM | POA: Diagnosis present

## 2020-07-12 LAB — ECHOCARDIOGRAM COMPLETE
Area-P 1/2: 2.66 cm2
S' Lateral: 3.15 cm

## 2020-07-12 MED ORDER — PERFLUTREN LIPID MICROSPHERE
1.0000 mL | INTRAVENOUS | Status: AC | PRN
  Administered 2020-07-12: 3 mL via INTRAVENOUS

## 2020-07-13 ENCOUNTER — Other Ambulatory Visit: Payer: Self-pay | Admitting: Nurse Practitioner

## 2020-07-13 DIAGNOSIS — I77810 Thoracic aortic ectasia: Secondary | ICD-10-CM

## 2020-07-19 ENCOUNTER — Ambulatory Visit: Payer: MEDICARE | Admitting: Family Medicine

## 2020-07-20 ENCOUNTER — Other Ambulatory Visit: Payer: Self-pay | Admitting: Nurse Practitioner

## 2020-07-20 DIAGNOSIS — I77810 Thoracic aortic ectasia: Secondary | ICD-10-CM

## 2020-07-20 NOTE — Progress Notes (Signed)
Ordered CT chest per cardiothoracic surgery office request

## 2020-07-22 ENCOUNTER — Encounter: Payer: Self-pay | Admitting: Family Medicine

## 2020-07-27 ENCOUNTER — Other Ambulatory Visit: Payer: Self-pay | Admitting: *Deleted

## 2020-07-27 MED ORDER — METFORMIN HCL ER 500 MG PO TB24: 1000.0000 mg | ORAL_TABLET | Freq: Every day | ORAL | 3 refills | Status: DC

## 2020-07-27 MED ORDER — AMLODIPINE BESYLATE 5 MG PO TABS: 5.0000 mg | ORAL_TABLET | Freq: Every day | ORAL | 3 refills | Status: DC

## 2020-08-22 ENCOUNTER — Encounter: Payer: Self-pay | Admitting: Family Medicine

## 2020-08-22 ENCOUNTER — Other Ambulatory Visit: Payer: 59

## 2020-08-22 ENCOUNTER — Telehealth: Payer: Self-pay

## 2020-08-22 NOTE — Telephone Encounter (Signed)
Please contact patient to discuss.  

## 2020-08-23 ENCOUNTER — Encounter: Payer: Self-pay | Admitting: Neurology

## 2020-08-23 ENCOUNTER — Other Ambulatory Visit: Payer: Self-pay

## 2020-08-23 ENCOUNTER — Ambulatory Visit (INDEPENDENT_AMBULATORY_CARE_PROVIDER_SITE_OTHER): Payer: MEDICARE | Admitting: Neurology

## 2020-08-23 VITALS — BP 148/89 | HR 93 | Ht 73.0 in | Wt 348.0 lb

## 2020-08-23 DIAGNOSIS — R0683 Snoring: Secondary | ICD-10-CM | POA: Diagnosis not present

## 2020-08-23 DIAGNOSIS — Z6841 Body Mass Index (BMI) 40.0 and over, adult: Secondary | ICD-10-CM

## 2020-08-23 DIAGNOSIS — E119 Type 2 diabetes mellitus without complications: Secondary | ICD-10-CM

## 2020-08-23 DIAGNOSIS — Z8669 Personal history of other diseases of the nervous system and sense organs: Secondary | ICD-10-CM | POA: Diagnosis not present

## 2020-08-23 DIAGNOSIS — Z9189 Other specified personal risk factors, not elsewhere classified: Secondary | ICD-10-CM

## 2020-08-23 DIAGNOSIS — K089 Disorder of teeth and supporting structures, unspecified: Secondary | ICD-10-CM | POA: Insufficient documentation

## 2020-08-23 NOTE — Patient Instructions (Signed)
Screening for Sleep Apnea  Sleep apnea is a condition in which breathing pauses or becomes shallow during sleep. Sleep apnea screening is a test to determine if you are at risk for sleep apnea. The test includes a series of questions. It will only takes a few minutes. Your health care provider may ask you to have this test in preparationfor surgery or as part of a physical exam. What are the symptoms of sleep apnea? Common symptoms of sleep apnea include: Snoring. Waking up often at night. Daytime sleepiness. Pauses in breathing. Choking or gasping during sleep. Irritability. Forgetfulness. Trouble thinking clearly. Depression. Personality changes. Most people with sleep apnea do not know that they have it. What are the advantages of sleep apnea screening? Getting screened for sleep apnea can help: Ensure your safety. It is important for your health care providers to know whether or not you have sleep apnea, especially if you are having surgery or have other long-term (chronic) health conditions. Improve your health and allow you to get a better night's rest. Restful sleep can help you: Have more energy. Lose weight. Improve high blood pressure. Improve diabetes management. Prevent stroke. Prevent car accidents. What happens during the screening? Screening usually includes being asked a list of questions about your sleep quality. Some questions you may be asked include: Do you snore? Is your sleep restless? Do you have daytime sleepiness? Has a partner or spouse told you that you stop breathing during sleep? Have you had trouble concentrating or memory loss? What is your age? What is your neck circumference? To measure your neck, keep your back straight and gently wrap the tape measure around your neck. Put the tape measure at the middle of your neck, between your chin and collarbone. What is your sex assigned at birth? Do you have or are you being treated for high blood  pressure? If your screening test is positive, you are at risk for the condition. Furthertesting may be needed to confirm a diagnosis of sleep apnea. Where to find more information You can find screening tools online or at your health care clinic. For more information about sleep apnea screening and healthy sleep, visit these websites: Centers for Disease Control and Prevention: FootballExhibition.com.br American Sleep Apnea Association: www.sleepapnea.org Contact a health care provider if: You think that you may have sleep apnea. Summary Sleep apnea screening can help determine if you are at risk for sleep apnea. It is important for your health care providers to know whether or not you have sleep apnea, especially if you are having surgery or have other chronic health conditions. You may be asked to take a screening test for sleep apnea in preparation for surgery or as part of a physical exam. This information is not intended to replace advice given to you by your health care provider. Make sure you discuss any questions you have with your healthcare provider. Document Revised: 01/01/2020 Document Reviewed: 01/01/2020 Elsevier Patient Education  2022 Elsevier Inc. Sleep Apnea Sleep apnea affects breathing during sleep. It causes breathing to stop for 10 seconds or more, or to become shallow. People with sleep apnea usually snoreloudly. It can also increase the risk of: Heart attack. Stroke. Being very overweight (obese). Diabetes. Heart failure. Irregular heartbeat. High blood pressure. The goal of treatment is to help you breathe normally again. What are the causes?  The most common cause of this condition is a collapsed or blocked airway. There are three kinds of sleep apnea: Obstructive sleep apnea. This is caused  by a blocked or collapsed airway. Central sleep apnea. This happens when the brain does not send the right signals to the muscles that control breathing. Mixed sleep apnea. This is a  combination of obstructive and central sleep apnea. What increases the risk? Being overweight. Smoking. Having a small airway. Being older. Being male. Drinking alcohol. Taking medicines to calm yourself (sedatives or tranquilizers). Having family members with the condition. Having a tongue or tonsils that are larger than normal. What are the signs or symptoms? Trouble staying asleep. Loud snoring. Headaches in the morning. Waking up gasping. Dry mouth or sore throat in the morning. Being sleepy or tired during the day. If you are sleepy or tired during the day, you may also: Not be able to focus your mind (concentrate). Forget things. Get angry a lot and have mood swings. Feel sad (depressed). Have changes in your personality. Have less interest in sex, if you are male. Be unable to have an erection, if you are male. How is this treated?  Sleeping on your side. Using a medicine to get rid of mucus in your nose (decongestant). Avoiding the use of alcohol, medicines to help you relax, or certain pain medicines (narcotics). Losing weight, if needed. Changing your diet. Quitting smoking. Using a machine to open your airway while you sleep, such as: An oral appliance. This is a mouthpiece that shifts your lower jaw forward. A CPAP device. This device blows air through a mask when you breathe out (exhale). An EPAP device. This has valves that you put in each nostril. A BPAP device. This device blows air through a mask when you breathe in (inhale) and breathe out. Having surgery if other treatments do not work. Follow these instructions at home: Lifestyle Make changes that your doctor recommends. Eat a healthy diet. Lose weight if needed. Avoid alcohol, medicines to help you relax, and some pain medicines. Do not smoke or use any products that contain nicotine or tobacco. If you need help quitting, ask your doctor. General instructions Take over-the-counter and  prescription medicines only as told by your doctor. If you were given a machine to use while you sleep, use it only as told by your doctor. If you are having surgery, make sure to tell your doctor you have sleep apnea. You may need to bring your device with you. Keep all follow-up visits. Contact a doctor if: The machine that you were given to use during sleep bothers you or does not seem to be working. You do not get better. You get worse. Get help right away if: Your chest hurts. You have trouble breathing in enough air. You have an uncomfortable feeling in your back, arms, or stomach. You have trouble talking. One side of your body feels weak. A part of your face is hanging down. These symptoms may be an emergency. Get help right away. Call your local emergency services (911 in the [place] Do not wait to see if the symptoms will go away. Do not drive yourself to the hospital. Summary This condition affects breathing during sleep. The most common cause is a collapsed or blocked airway. The goal of treatment is to help you breathe normally while you sleep. This information is not intended to replace advice given to you by your health care provider. Make sure you discuss any questions you have with your healthcare provider. Document Revised: 01/01/2020 Document Reviewed: 01/01/2020 Elsevier Patient Education  2022 [employer].

## 2020-08-23 NOTE — Telephone Encounter (Signed)
Patient aware per MyChart.   

## 2020-08-23 NOTE — Progress Notes (Addendum)
SLEEP MEDICINE CLINIC    Provider:  Melvyn Novas, MD  Primary Care Physician:  Donita Brooks, MD [address] [address] [place] [address]     Referring Provider: Donita Brooks, Md [address] [place],  [place] [address]          Chief Complaint according to patient   Patient presents with:     New Patient (Initial Visit)     Presents today for slp consult. States he had a SS 20 yrs ago and was told that he had sleep apnea but was never treated. This study was in [place]. Pt states he has always avg about 10 hrs of sleep and took a nap during day even since hewas a kid. He snores in sleep and wife has witnessed apnea events      HISTORY OF PRESENT ILLNESS:  Aaron Duncan is a 65 y.o. year old White or Caucasian male patient seen here as a referral on 08/23/2020 from PCP  for a Sleep Apnea evaluation. .  Chief concern according to patient :  Presents today for slp consult. States he had a PSG 20 years ago and was told that he had sleep apnea but was never treated.  He refused treatment as he didn't feel his symptoms matched the diagnosis, he just snores. This study was in [place]. Pt states he has always gets about 10 hours of sleep and took a nap during day even since he was a kid. 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 He snores in sleep and wife has witnessed apnea events   I have the pleasure of seeing Aaron Duncan today, a right -handed White or Caucasian male with a known sleep disorder.  He  has a past medical history of DDD (degenerative disc disease), lumbosacral, Hepatic steatosis, Hyperlipidemia, Hypertension, Morbid obesity (HCC), and untreated Sleep apnea.    Sleep relevant medical history: life long snorer, wife has witnessed apnea-  Nocturia/ Enuresis 4 times a night !!! Takes a diuretic at night, urge incontinence. No TBI or neck injuries.    Family medical /sleep history: brother has OSA.  He is the  largest member of his family, many other family members were snoring. Father died of cancer.   Social history:  Patient is retired from Tour manager and lives in a household with  persons/ alone. Family status is married , with adult daughter who  is 96 spouse. 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  Pets are present- dachshund.  Tobacco use never .  ETOH use 2 glasses of red wine a day ,  Caffeine intake in form of Coffee( 2 cups in AM ) Soda( /) Tea ( /) or energy drinks. Regular exercise; walked at the job.  Housekeeping.    Sleep habits are as follows: he skips breakfast or has a protein drink.  The patient's dinner time is between 5-8 PM. The patient goes to bed at 9-12 PM and continues to sleep for intervals of 2-3 hours, wakes for many bathroom breaks. The preferred sleep position is non-supine , with the support of 1  pillow. Dreams are reportedly frequent/vivid.  7-9  AM is the usual rise time. The patient wakes up spontaneously.  He reports not feeling fully refreshed or restored in AM, with symptoms such as dry mouth, Naps are taken infrequently, lasting from 20-60 minutes only after a  short night- and are refreshing.     Review of Systems: Out of a complete 14 system review, the patient complains of only the following symptoms, and all other reviewed systems are negative.:  Fatigue, dry mouth, snoring, nocturia fragmented sleep.   How likely are you to doze in the following situations: 0 = not likely, 1 = slight chance, 2 = moderate chance, 3 = high chance   Sitting and Reading? Watching Television? Sitting inactive in a public place (theater or meeting)? As a passenger in a car for an hour without a break? Lying down in the afternoon when circumstances permit? Sitting and talking to someone? Sitting quietly after lunch without alcohol? In a car, while stopped for a few minutes in traffic?   Total = 5 / 24 points   FSS endorsed at 29/ 63 points.   Social  History   Socioeconomic History   Marital status: Married    Spouse name: Not on file   Number of children: Not on file   Years of education: Not on file   Highest education level: Not on file  Occupational History   Not on file  Tobacco Use   Smoking status: Never   Smokeless tobacco: Never  Vaping Use   Vaping Use: Never used  Substance and Sexual Activity   Alcohol use: Yes    Alcohol/week: 2.0 standard drinks    Types: 2 Glasses of wine per week   Drug use: Never   Sexual activity: Not Currently  Other Topics Concern   Not on file  Social History Narrative   Not on file   Social Determinants of Health   Financial Resource Strain: Not on file  Food Insecurity: Not on file  Transportation Needs: Not on file  Physical Activity: Not on file  Stress: Not on file  Social Connections: Not on file    Family History  Problem Relation Age of Onset   Diabetes Mother    AAA (abdominal aortic aneurysm) Father    Cancer Father        lung   Diabetes Maternal Uncle    AAA (abdominal aortic aneurysm) Paternal Grandfather    Colon polyps Brother    Colon cancer Neg Hx    Esophageal cancer Neg Hx    Rectal cancer Neg Hx    Stomach cancer Neg Hx     Past Medical History:  Diagnosis Date   DDD (degenerative disc disease), lumbosacral    Hepatic steatosis    Hyperlipidemia    Hypertension    Morbid obesity (HCC)    Sleep apnea     Past Surgical History:  Procedure Laterality Date   right elbow tendonitis     work related injury   SKIN CANCER EXCISION Right 2019   basal cell ca R side of face     Current Outpatient Medications on File Prior to Visit  Medication Sig Dispense Refill   amLODipine (NORVASC) 5 MG tablet Take 1 tablet (5 mg total) by mouth daily. 90 tablet 3   Ginger, Zingiber officinalis, (GINGER PO) Take by mouth.     losartan-hydrochlorothiazide (HYZAAR) 100-25 MG tablet TAKE 1 TABLET DAILY 90 tablet 3   magnesium 30 MG tablet Take 30 mg by mouth  2 (two) times daily.     metFORMIN (GLUCOPHAGE XR) 500 MG 24 hr tablet Take 2 tablets (1,000 mg total) by mouth daily with breakfast. 180 tablet 3   Multiple Vitamin (MULTIVITAMIN WITH MINERALS) TABS tablet Take 1 tablet by  mouth daily.     OVER THE COUNTER MEDICATION CBD Oil     TURMERIC PO Take by mouth.     vitamin B-12 (CYANOCOBALAMIN) 100 MCG tablet Take 100 mcg by mouth daily.     No current facility-administered medications on file prior to visit.    No Known Allergies  Physical exam:  Today's Vitals   08/23/20 1508  BP: (!) 148/89  Pulse: 93  Weight: (!) 348 lb (157.9 kg)  Height: 6\' 1"  (1.854 m)   Body mass index is 45.91 kg/m.   Wt Readings from Last 3 Encounters:  08/23/20 (!) 348 lb (157.9 kg)  06/23/20 (!) 365 lb (165.6 kg)  06/02/20 (!) 358 lb (162.4 kg)     Ht Readings from Last 3 Encounters:  08/23/20 6\' 1"  (1.854 m)  06/23/20 6\' 1"  (1.854 m)  06/02/20 6\' 1"  (1.854 m)      General: The patient is awake, alert and appears not in acute distress. The patient is well groomed. Head: Normocephalic, atraumatic. Neck is supple. Mallampati  3,  neck circumference:21.5" inches . Nasal airflow congested .  Retrognathia is not  seen.  Dental status: poor, very many gaps, irregular dentition-   Cardiovascular:  Regular rate and cardiac rhythm by pulse,  without distended neck veins. Respiratory: Lungs are clear to auscultation.  Skin:  Without evidence of ankle edema, or rash. Trunk: The patient's posture is erect.   Neurologic exam : The patient is awake and alert, oriented to place and time.   Memory subjective described as intact.  Attention span & concentration ability appears normal.  Speech is fluent,  without  dysarthria, dysphonia or aphasia.  Mood and affect are appropriate.   Cranial nerves: no loss of smell or taste reported  Pupils are equal and briskly reactive to light. Funduscopic exam deferred. .  Extraocular movements in vertical and  horizontal planes were intact and without nystagmus. No Diplopia. Visual fields by finger perimetry are intact.Hearing was impaired-to soft voice and finger rubbing.    Facial sensation intact to fine touch. Facial motor strength is symmetric and tongue and uvula move midline.  Neck ROM : rotation, tilt and flexion extension were normal for age and shoulder shrug was symmetrical.    Motor exam:  Symmetric bulk, tone and ROM.  severely overweight.  Normal tone without cog-wheeling, symmetric grip strength .   Sensory:  Fine touch, pinprick and vibration were tested  and vibration was not felt on either knee, nor ankles.  Proprioception tested in the upper extremities was normal. Coordination: Rapid alternating movements in the fingers/hands were of normal speed.  The Finger-to-nose maneuver was intact without evidence of ataxia, dysmetria or tremor.   Gait and station: Patient could rise unassisted from a seated position, walked without assistive device.  Stance is of wider base .  Toe and heel walk were deferred.  Deep tendon reflexes: in the upper and lower extremities are symmetric and intact.  Babinski response was deferred.     After spending a total time of 45 minutes face to face and additional time for physical and neurologic examination, review of laboratory studies,  personal review of imaging studies, reports and results of other testing and review of referral information / records as far as provided in visit, I have established the following assessments:  1)  High risk for apnea-  by BMI over 45, and by oral anatomy and dentition- there is no room for his tongue to go. Large neck, high  grade Mallampati.  2) loud snoring in the past, not recently confirmed.  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    My Plan is to proceed with:  1) HST or SPLIT study for this patient who denies any  hypersomnia but is at high risk of sleep apnea. He had a colonoscopy under sedation and was told his apnea interfered with the post op observation.  2) recommend dental care  3) consider nasal spray before sleep time to allow better nasal patency.  4) consider referral to weight and wellness.   I would like to thank  Donita Brooks, Md [address] [place],  [place] [address] for allowing me to meet with and to take care of this pleasant patient.   In short, Aaron Duncan is presenting with most likely untreated OSA . I plan to follow up either personally or through our NP within 2-4  month.   CC: I will share my notes with PCP.  Electronically signed by: Melvyn Novas, MD 08/23/2020 3:15 PM  [facility] Board certified by The [employer] of Sleep Medicine and Diplomate of the [employer] of Sleep Medicine. Board certified In Neurology through the ABPN, Fellow of the [employer] of Neurology. Medical Director of [facility].

## 2020-08-24 ENCOUNTER — Telehealth: Payer: Self-pay

## 2020-08-24 NOTE — Telephone Encounter (Signed)
LM w/pt's wife for pt to call me back to schedule sleep study

## 2020-08-25 ENCOUNTER — Ambulatory Visit: Payer: MEDICARE | Admitting: Family Medicine

## 2020-08-25 ENCOUNTER — Encounter: Payer: MEDICARE | Admitting: Cardiothoracic Surgery

## 2020-08-26 ENCOUNTER — Ambulatory Visit (INDEPENDENT_AMBULATORY_CARE_PROVIDER_SITE_OTHER): Payer: MEDICARE | Admitting: Family Medicine

## 2020-08-26 ENCOUNTER — Encounter: Payer: Self-pay | Admitting: Family Medicine

## 2020-08-26 ENCOUNTER — Other Ambulatory Visit: Payer: Self-pay

## 2020-08-26 VITALS — BP 146/80 | HR 80 | Temp 98.1°F | Resp 16 | Ht 73.0 in | Wt 348.0 lb

## 2020-08-26 DIAGNOSIS — I77810 Thoracic aortic ectasia: Secondary | ICD-10-CM

## 2020-08-26 DIAGNOSIS — E119 Type 2 diabetes mellitus without complications: Secondary | ICD-10-CM

## 2020-08-26 MED ORDER — CLOTRIMAZOLE-BETAMETHASONE 1-0.05 % EX CREA: 1.0000 "application " | TOPICAL_CREAM | Freq: Two times a day (BID) | CUTANEOUS | 1 refills | Status: DC

## 2020-08-26 NOTE — Progress Notes (Signed)
Subjective:    Patient ID: Aaron Duncan, male    DOB: Jun 21, 1955, 65 y.o.   MRN: [id number]  HPI  06/23/20 Patient is here today to discuss lab work.  A1c was elevated at 8.4.  Cholesterol was also elevated with an LDL cholesterol well above 100.  Blood pressure here is still elevated at 148/82.  Patient is very hesitant to add any medication.  He wants to try to make lifestyle changes to address these abnormalities.  We had a long discussion today regarding a low carbohydrate diet, exercise, weight loss.  However I was also realistic with the patient about his increased cardiovascular risk.  I explained to him my concerns regarding not treating his blood pressure and blood sugar more aggressively.  At that time, my plan was:  I respect the fact that the patient does not want to add additional medication however also explained to him that he is at increased risk of cardiovascular complications if we do not aggressively treat these conditions.  Therefore, I have recommended 50 pounds of weight loss over the next 3 to 6 months.  I have recommended a low carbohydrate diet.  I want him to try to consume less than 45 g of carbohydrates per meal.  We discussed how to determine the amount of carbohydrates and food and what foods he needs to try to avoid and what foods he can eat more liberally.  In addition I want to start the patient on metformin extended release 1000 mg p.o. every morning.  He is hesitant to add medication but he is willing to concede to at least take the metformin.  Also want to more aggressively treat his blood pressure and we will add amlodipine 5 mg a day and monitor for any swelling.  Repeat lab work in 3 months.  If significant improvement is not being seen in his sugars and cholesterol at that time I would try to convince the patient to add a statin and uptitrate his diabetes medication.  08/26/20 Patient was unable to increase metformin.  He is only been taking 1 pill a day of the 500 due  to diarrhea.  He has drastically changed his diet and he has lost 10 pounds.  He does have an intertrigo like rash in the gluteal cleft that has been present for about 1 month per his report.  He is here today to discuss options to treat diabetes Past Medical History:  Diagnosis Date   DDD (degenerative disc disease), lumbosacral    Hepatic steatosis    Hyperlipidemia    Hypertension    Morbid obesity (HCC)    Sleep apnea    Past Surgical History:  Procedure Laterality Date   right elbow tendonitis     work related injury   SKIN CANCER EXCISION Right 2019   basal cell ca R side of face   Current Outpatient Medications on File Prior to Visit  Medication Sig Dispense Refill   amLODipine (NORVASC) 5 MG tablet Take 1 tablet (5 mg total) by mouth daily. 90 tablet 3   Ginger, Zingiber officinalis, (GINGER PO) Take by mouth.     losartan-hydrochlorothiazide (HYZAAR) 100-25 MG tablet TAKE 1 TABLET DAILY 90 tablet 3   magnesium 30 MG tablet Take 30 mg by mouth 2 (two) times daily.     metFORMIN (GLUCOPHAGE XR) 500 MG 24 hr tablet Take 2 tablets (1,000 mg total) by mouth daily with breakfast. 180 tablet 3   Multiple Vitamin (MULTIVITAMIN WITH MINERALS) TABS tablet  Take 1 tablet by mouth daily.     OVER THE COUNTER MEDICATION CBD Oil     TURMERIC PO Take by mouth.     vitamin B-12 (CYANOCOBALAMIN) 100 MCG tablet Take 100 mcg by mouth daily.     No current facility-administered medications on file prior to visit.   No Known Allergies Social History   Socioeconomic History   Marital status: Married    Spouse name: Not on file   Number of children: Not on file   Years of education: Not on file   Highest education level: Not on file  Occupational History   Not on file  Tobacco Use   Smoking status: Never   Smokeless tobacco: Never  Vaping Use   Vaping Use: Never used  Substance and Sexual Activity   Alcohol use: Yes    Alcohol/week: 2.0 standard drinks    Types: 2 Glasses of wine  per week   Drug use: Never   Sexual activity: Not Currently  Other Topics Concern   Not on file  Social History Narrative   Not on file   Social Determinants of Health   Financial Resource Strain: Not on file  Food Insecurity: Not on file  Transportation Needs: Not on file  Physical Activity: Not on file  Stress: Not on file  Social Connections: Not on file  Intimate Partner Violence: Not on file  Patient is a retired Financial controller Family History  Problem Relation Age of Onset   Diabetes Mother    AAA (abdominal aortic aneurysm) Father    Cancer Father        lung   Diabetes Maternal Uncle    AAA (abdominal aortic aneurysm) Paternal Grandfather    Colon polyps Brother    Colon cancer Neg Hx    Esophageal cancer Neg Hx    Rectal cancer Neg Hx    Stomach cancer Neg Hx       Review of Systems     Objective:   Physical Exam Constitutional:      Appearance: He is obese.  Cardiovascular:     Rate and Rhythm: Normal rate and regular rhythm.     Heart sounds: Normal heart sounds.  Pulmonary:     Effort: Pulmonary effort is normal.     Breath sounds: Normal breath sounds.  Musculoskeletal:       Back:  Skin:    Findings: Erythema and rash present.  Neurological:     Mental Status: He is alert.    Chronic venous stasis changes on both legs left greater than right with varicose veins and trace pitting edema      Assessment & Plan:  New onset type 2 diabetes mellitus (HCC)  Ascending aorta dilatation (HCC) If the patient cannot tolerate metformin, his options would be Actos, glipizide, Trulicity, or [place].  We discussed the pros and cons of each of these medications.  He will check on his insurance coverage and let me know his decision.  Return in 1 month fasting to repeat an A1c.  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  Recommended a CT scan.  Patient left previous appointment for  the CT scan because the staff "was being rude".  Encouraged him to follow-up and get the scan completed.

## 2020-08-29 ENCOUNTER — Telehealth: Payer: Self-pay

## 2020-08-29 NOTE — Telephone Encounter (Signed)
Pt CT rerouted to [facility] from [place] due to bad experience

## 2020-08-29 NOTE — Telephone Encounter (Signed)
LVM for pt to call me back to schedule sleep study  

## 2020-09-02 ENCOUNTER — Other Ambulatory Visit: Payer: Self-pay

## 2020-09-02 ENCOUNTER — Ambulatory Visit (HOSPITAL_COMMUNITY)
Admission: RE | Admit: 2020-09-02 | Discharge: 2020-09-02 | Disposition: A | Discharge status: Home / Self Care | Payer: MEDICARE | Source: Ambulatory Visit | Attending: Nurse Practitioner | Admitting: Nurse Practitioner

## 2020-09-02 DIAGNOSIS — I719 Aortic aneurysm of unspecified site, without rupture: Secondary | ICD-10-CM | POA: Insufficient documentation

## 2020-09-02 DIAGNOSIS — I700 Atherosclerosis of aorta: Secondary | ICD-10-CM | POA: Diagnosis not present

## 2020-09-02 DIAGNOSIS — I7121 Aneurysm of the ascending aorta, without rupture: Secondary | ICD-10-CM

## 2020-09-02 DIAGNOSIS — I2510 Atherosclerotic heart disease of native coronary artery without angina pectoris: Secondary | ICD-10-CM | POA: Diagnosis not present

## 2020-09-02 DIAGNOSIS — I77810 Thoracic aortic ectasia: Secondary | ICD-10-CM | POA: Diagnosis present

## 2020-09-02 DIAGNOSIS — I712 Thoracic aortic aneurysm, without rupture: Secondary | ICD-10-CM

## 2020-09-02 HISTORY — DX: Aneurysm of the ascending aorta, without rupture: I71.21

## 2020-09-02 HISTORY — DX: Thoracic aortic aneurysm, without rupture: I71.2

## 2020-09-05 ENCOUNTER — Encounter: Payer: Self-pay | Admitting: Nurse Practitioner

## 2020-09-05 DIAGNOSIS — I719 Aortic aneurysm of unspecified site, without rupture: Secondary | ICD-10-CM | POA: Insufficient documentation

## 2020-09-05 DIAGNOSIS — I700 Atherosclerosis of aorta: Secondary | ICD-10-CM | POA: Insufficient documentation

## 2020-09-07 ENCOUNTER — Other Ambulatory Visit: Payer: Self-pay | Admitting: Nurse Practitioner

## 2020-09-07 ENCOUNTER — Encounter: Payer: Self-pay | Admitting: Thoracic Surgery (Cardiothoracic Vascular Surgery)

## 2020-09-07 ENCOUNTER — Other Ambulatory Visit: Payer: Self-pay

## 2020-09-07 ENCOUNTER — Institutional Professional Consult (permissible substitution) (INDEPENDENT_AMBULATORY_CARE_PROVIDER_SITE_OTHER): Payer: MEDICARE | Admitting: Thoracic Surgery (Cardiothoracic Vascular Surgery)

## 2020-09-07 VITALS — BP 150/90 | HR 85 | Resp 20 | Ht 73.0 in | Wt 350.0 lb

## 2020-09-07 DIAGNOSIS — I7121 Aneurysm of the ascending aorta, without rupture: Secondary | ICD-10-CM

## 2020-09-07 DIAGNOSIS — I712 Thoracic aortic aneurysm, without rupture: Secondary | ICD-10-CM | POA: Diagnosis not present

## 2020-09-07 MED ORDER — ATORVASTATIN CALCIUM 40 MG PO TABS
40.0000 mg | ORAL_TABLET | Freq: Every day | ORAL | 0 refills | Status: DC
Start: 2020-09-07 — End: 2020-09-07

## 2020-09-07 NOTE — Progress Notes (Signed)
PCP is Donita Brooks, MD Referring Provider is Valentino Nose, NP  Chief Complaint  Patient presents with   Thoracic Aortic Aneurysm    Surgical consult, Chest CT 09/02/20    HPI: Aaron Duncan sent for consultation regarding an ascending thoracic aortic aneurysm.  Aaron Duncan is a 65 year old man with a past history significant for morbid obesity, hypertension, hyperlipidemia, probable sleep apnea, hepatic steatosis, and degenerative disc disease of the lumbosacral region.  Aaron Duncan Aaron Duncan.  As part of his evaluation Aaron Duncan had an echocardiogram which showed normal left ventricular function but did show a dilated ascending aorta.  It was estimated at 4.8 cm.Marland Kitchen  Aaron Duncan then had a CT of the chest which showed a 4.6 cm ascending aneurysm.  Aaron Duncan has no prior history of coronary disease.  Aaron Duncan does have a strong family history for abdominal aneurysms present with his father and paternal grandfather having aneurysms.  Aaron Duncan has been dieting and has lost about 40 pounds over the past 3 months.  Aaron Duncan denies any chest pain, pressure, or tightness.  Aaron Duncan is short of breath with physical exertion but nothing out of the ordinary or that has changed recently.  Aaron Duncan does have some very mild swelling in his legs and some discoloration of the skin that Aaron Duncan was told was due to blood pooling in his legs.  Past Medical History:  Diagnosis Date   DDD (degenerative disc disease), lumbosacral    Hepatic steatosis    Hyperlipidemia    Hypertension    Morbid obesity (HCC)    Sleep apnea     Past Surgical History:  Procedure Laterality Date   right elbow tendonitis     work related injury   SKIN CANCER EXCISION Right 2019   basal cell ca R side of face    Family History  Problem Relation Age of Onset   Diabetes Mother    AAA (abdominal aortic aneurysm) Father    Cancer Father        lung   Diabetes Maternal Uncle    AAA (abdominal aortic aneurysm) Paternal Grandfather    Colon polyps Brother    Colon cancer Neg Hx     Esophageal cancer Neg Hx    Rectal cancer Neg Hx    Stomach cancer Neg Hx     Social History Social History   Tobacco Use   Smoking status: Never   Smokeless tobacco: Never  Vaping Use   Vaping Use: Never used  Substance Use Topics   Alcohol use: Yes    Alcohol/week: 2.0 standard drinks    Types: 2 Glasses of wine per week   Drug use: Never    Current Outpatient Medications  Medication Sig Dispense Refill   amLODipine (NORVASC) 5 MG tablet Take 1 tablet (5 mg total) by mouth daily. 90 tablet 3   atorvastatin (LIPITOR) 40 MG tablet TAKE 1 TABLET(40 MG) BY MOUTH DAILY 90 tablet 1   cholecalciferol (VITAMIN D3) 25 MCG (1000 UNIT) tablet Take 1,000 Units by mouth daily.     clotrimazole-betamethasone (LOTRISONE) cream Apply 1 application topically 2 (two) times daily. 30 g 1   Ginger, Zingiber officinalis, (GINGER PO) Take by mouth.     losartan-hydrochlorothiazide (HYZAAR) 100-25 MG tablet TAKE 1 TABLET DAILY 90 tablet 3   magnesium 30 MG tablet Take 30 mg by mouth 2 (two) times daily.     metFORMIN (GLUCOPHAGE XR) 500 MG 24 hr tablet Take 2 tablets (1,000 mg total) by mouth daily with breakfast.  180 tablet 3   Multiple Vitamin (MULTIVITAMIN WITH MINERALS) TABS tablet Take 1 tablet by mouth daily.     OVER THE COUNTER MEDICATION CBD Oil     pyridOXINE (VITAMIN B-6) 100 MG tablet Take 100 mg by mouth daily.     TURMERIC PO Take by mouth.     No current facility-administered medications for this visit.    No Known Allergies  Review of Systems  Constitutional:  Positive for fatigue. Negative for activity change.       Has lost 40 pounds over past 3 months with diet  HENT:  Negative for trouble swallowing and voice change.   Respiratory:  Positive for apnea (Sleep study in next 2 to 3 weeks). Negative for shortness of breath.   Cardiovascular:  Positive for leg swelling (Trace). Negative for chest pain.  Gastrointestinal:  Negative for abdominal distention and abdominal pain.   Genitourinary:  Negative for difficulty urinating and dysuria.  Musculoskeletal:  Positive for arthralgias and back pain.  Skin:  Positive for color change (Mild venous stasis and hemosiderosis lower legs) and rash.  Neurological:  Negative for syncope and weakness.  Hematological:  Negative for adenopathy. Does not bruise/bleed easily.   There were no vitals taken for this visit. Physical Exam Vitals reviewed.  Constitutional:      General: Aaron Duncan is not in acute distress.    Appearance: Aaron Duncan is obese.  HENT:     Head: Normocephalic and atraumatic.  Eyes:     General: No scleral icterus.    Extraocular Movements: Extraocular movements intact.  Neck:     Vascular: No carotid bruit.  Cardiovascular:     Rate and Rhythm: Normal rate and regular rhythm.     Heart sounds: Normal heart sounds. No murmur heard.   No friction rub. No gallop.  Pulmonary:     Effort: Pulmonary effort is normal. No respiratory distress.     Breath sounds: Normal breath sounds. No wheezing or rales.  Abdominal:     General: There is no distension.     Palpations: Abdomen is soft.     Tenderness: There is no abdominal tenderness.  Musculoskeletal:     Cervical back: Neck supple.  Skin:    General: Skin is warm and dry.  Neurological:     General: No focal deficit present.     Mental Status: Aaron Duncan is alert and oriented to person, place, and time.     Cranial Nerves: No cranial nerve deficit.     Motor: No weakness.  Trace edema at ankles  Diagnostic Tests: CT CHEST WITHOUT CONTRAST   TECHNIQUE: Multidetector CT imaging of the chest was performed following the standard protocol without IV contrast.   COMPARISON:  10/31/2017   FINDINGS: Cardiovascular: There is fusiform aneurysmal dilation of the ascending thoracic aorta measuring up to 46 mm. Mild scattered atherosclerotic calcifications. The heart is normal in size. No pericardial effusion. Moderate coronary atherosclerotic calcifications.    Mediastinum/Nodes: No enlarged mediastinal or axillary lymph nodes. Thyroid gland, trachea, and esophagus demonstrate no significant findings.   Lungs/Pleura: Similar appearing mild right greater than left bibasilar traction bronchiectasis and mild right cicatricial atelectasis. No suspicious pulmonary nodules. No pleural effusion or pneumothorax.   Upper Abdomen: Again seen are multifocal well-circumscribed hypoattenuating lesions throughout the hepatic parenchyma, the largest in segment 2 measuring up to 2.3 cm. No new masses. The gallbladder is present unremarkable. No intra or extrahepatic biliary ductal dilation. The remaining visualized upper abdomen is within normal limits.  Musculoskeletal: Multilevel degenerative changes of thoracic spine. No acute osseous abnormality. No aggressive appearing osseous lesion.   IMPRESSION: 1. Fusiform ascending thoracic aneurysm measuring up to 4.6 cm. Ascending thoracic aortic aneurysm. Recommend semi-annual imaging followup by CTA or MRA and referral to cardiothoracic surgery if not already obtained. This recommendation follows 2010 ACCF/AHA/AATS/ACR/ASA/SCA/SCAI/SIR/STS/SVM Guidelines for the Diagnosis and Management of Patients With Thoracic Aortic Disease. Circulation. 2010; 121: L275-T700. Aortic aneurysm NOS (ICD10-I71.9) 2. Coronary and aortic atherosclerosis (ICD10-I70.0). 3. No acute intrathoracic abnormality.   Marliss Coots, MD   [facility]   [facility]     Electronically Signed   By: Marliss Coots MD   On: 09/05/2020 10:34 I personally reviewed the CT images.  There are some very mild atherosclerotic calcifications in the coronaries and aorta.  4.6 cm ascending aneurysm  Impression: Aaron Duncan is a 65 year old man with a past medical history significant for morbid obesity, hypertension, hyperlipidemia, sleep apnea and degenerative disc disease in the lumbosacral spine.   Aaron Duncan recently had a wellness check.  Aaron Duncan had an echocardiogram to look for any signs of heart failure or diastolic dysfunction.  His EF was normal and Aaron Duncan did not have any significant valvular pathology, but Aaron Duncan was noted to have an ascending aneurysm.  Ascending thoracic aortic aneurysm and thoracic aortic atherosclerosis-4.6 cm by CT of the chest.  No indication for surgery at this time.  I reviewed with the patient that the indications for surgery would be an increase of 5 mm in 6 months or size greater than 5.5 cm.  At the current size the risk of the surgery is higher than the risk of the aneurysm.  Aaron Duncan will need semiannual follow-up.  Hypertension-systolic blood pressure elevated today at 150.  Aaron Duncan says Aaron Duncan checks himself at home regularly and it usually runs between 115 and 125.  I emphasized the importance of blood pressure control as the main treatment for his ascending aneurysm.  Aaron Duncan is on amlodipine and lisinopril-hydrochlorothiazide.  Dyslipidemia-total cholesterol 182.  HDL low at 37 and LDL elevated at 123.  Defer to Aaron Duncan.  Morbid obesity-Aaron Duncan is aware of the negative impact of morbid obesity on his overall health.  Aaron Duncan has lost a tremendous amount of weight over the past several months and is continuing to diet.  I emphasized the importance of weight loss for his cardiovascular health.  Coronary calcification noted on CT-asymptomatic.  Plan: Return in 6 months with CT chest  Loreli Slot, MD [facility] [phone]

## 2020-09-22 ENCOUNTER — Ambulatory Visit: Payer: 59 | Admitting: Family Medicine

## 2020-10-06 ENCOUNTER — Ambulatory Visit: Payer: MEDICARE | Admitting: Family Medicine

## 2020-10-25 ENCOUNTER — Other Ambulatory Visit: Payer: Self-pay

## 2020-10-25 ENCOUNTER — Encounter: Payer: Self-pay | Admitting: Family Medicine

## 2020-10-25 ENCOUNTER — Ambulatory Visit (INDEPENDENT_AMBULATORY_CARE_PROVIDER_SITE_OTHER): Payer: MEDICARE | Admitting: Family Medicine

## 2020-10-25 VITALS — BP 132/78 | HR 88 | Temp 98.2°F | Resp 16 | Ht 73.0 in | Wt 328.0 lb

## 2020-10-25 DIAGNOSIS — I712 Thoracic aortic aneurysm, without rupture, unspecified: Secondary | ICD-10-CM

## 2020-10-25 DIAGNOSIS — Z23 Encounter for immunization: Secondary | ICD-10-CM

## 2020-10-25 DIAGNOSIS — E119 Type 2 diabetes mellitus without complications: Secondary | ICD-10-CM

## 2020-10-25 DIAGNOSIS — E7800 Pure hypercholesterolemia, unspecified: Secondary | ICD-10-CM

## 2020-10-25 DIAGNOSIS — I10 Essential (primary) hypertension: Secondary | ICD-10-CM

## 2020-10-25 NOTE — Progress Notes (Signed)
Subjective:    Patient ID: Aaron Duncan, male    DOB: 1955-10-05, 65 y.o.   MRN: [id number]  HPI  06/23/20 Patient is here today to discuss lab work.  A1c was elevated at 8.4.  Cholesterol was also elevated with an LDL cholesterol well above 100.  Blood pressure here is still elevated at 148/82.  Patient is very hesitant to add any medication.  He wants to try to make lifestyle changes to address these abnormalities.  We had a long discussion today regarding a low carbohydrate diet, exercise, weight loss.  However I was also realistic with the patient about his increased cardiovascular risk.  I explained to him my concerns regarding not treating his blood pressure and blood sugar more aggressively.  At that time, my plan was:  I respect the fact that the patient does not want to add additional medication however also explained to him that he is at increased risk of cardiovascular complications if we do not aggressively treat these conditions.  Therefore, I have recommended 50 pounds of weight loss over the next 3 to 6 months.  I have recommended a low carbohydrate diet.  I want him to try to consume less than 45 g of carbohydrates per meal.  We discussed how to determine the amount of carbohydrates and food and what foods he needs to try to avoid and what foods he can eat more liberally.  In addition I want to start the patient on metformin extended release 1000 mg p.o. every morning.  He is hesitant to add medication but he is willing to concede to at least take the metformin.  Also want to more aggressively treat his blood pressure and we will add amlodipine 5 mg a day and monitor for any swelling.  Repeat lab work in 3 months.  If significant improvement is not being seen in his sugars and cholesterol at that time I would try to convince the patient to add a statin and uptitrate his diabetes medication.  08/26/20 Patient was unable to increase metformin.  He is only been taking 1 pill a day of the 500 due  to diarrhea.  He has drastically changed his diet and he has lost 10 pounds.  He does have an intertrigo like rash in the gluteal cleft that has been present for about 1 month per his report.  He is here today to discuss options to treat diabetes.  At that time my plan was:  If the patient cannot tolerate metformin, his options would be Actos, glipizide, Trulicity, or [place].  We discussed the pros and cons of each of these medications.  He will check on his insurance coverage and let me know his decision.  Return in 1 month fasting to repeat an A1c.  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  Recommended a CT scan.  Patient left previous appointment for the CT scan because the staff "was being rude".  Encouraged him to follow-up and get the scan completed.  10/25/20 Wt Readings from Last 3 Encounters:  10/25/20 (!) 328 lb (148.8 kg)  09/07/20 (!) 350 lb (158.8 kg)  08/26/20 (!) 348 lb (157.9 kg)  Since I last saw the patient, he stopped taking metformin.  He stopped taking Lipitor.  However he worked extremely hard on his diet and has lost 22 pounds!  He states that his blood pressure is running between 100-150.  He never sees the blood sugar above  150 even after eating.  He denies any hypoglycemic episodes.  He denies any chest pain shortness of breath or dyspnea on exertion.  He denies any neuropathy in his feet.  However he does not want to take a statin.  We discussed the cardiovascular protective benefits of statins and he declines them.  He is due for fasting lab work  Past Medical History:  Diagnosis Date   Ascending aortic aneurysm (HCC) 09/02/2020   4.6 cm by CT   DDD (degenerative disc disease), lumbosacral    Hepatic steatosis    Hyperlipidemia    Hypertension    Morbid obesity (HCC)    Sleep apnea    Past Surgical History:  Procedure Laterality Date   right elbow tendonitis     work related injury   SKIN  CANCER EXCISION Right 2019   basal cell ca R side of face    Current Outpatient Medications on File Prior to Visit  Medication Sig Dispense Refill   amLODipine (NORVASC) 5 MG tablet Take 1 tablet (5 mg total) by mouth daily. 90 tablet 3   cholecalciferol (VITAMIN D3) 25 MCG (1000 UNIT) tablet Take 1,000 Units by mouth daily.     clotrimazole-betamethasone (LOTRISONE) cream Apply 1 application topically 2 (two) times daily. 30 g 1   Ginger, Zingiber officinalis, (GINGER PO) Take by mouth.     losartan-hydrochlorothiazide (HYZAAR) 100-25 MG tablet TAKE 1 TABLET DAILY 90 tablet 3   magnesium 30 MG tablet Take 30 mg by mouth 2 (two) times daily.     Multiple Vitamin (MULTIVITAMIN WITH MINERALS) TABS tablet Take 1 tablet by mouth daily.     OVER THE COUNTER MEDICATION CBD Oil     pyridOXINE (VITAMIN B-6) 100 MG tablet Take 100 mg by mouth daily.     TURMERIC PO Take by mouth.     vitamin B-12 (CYANOCOBALAMIN) 100 MCG tablet Take 100 mcg by mouth daily.     atorvastatin (LIPITOR) 40 MG tablet TAKE 1 TABLET(40 MG) BY MOUTH DAILY (Patient not taking: Reported on 10/25/2020) 90 tablet 1   metFORMIN (GLUCOPHAGE XR) 500 MG 24 hr tablet Take 2 tablets (1,000 mg total) by mouth daily with breakfast. (Patient not taking: Reported on 10/25/2020) 180 tablet 3   No current facility-administered medications on file prior to visit.    No Known Allergies Social History   Socioeconomic History   Marital status: Married    Spouse name: Not on file   Number of children: Not on file   Years of education: Not on file   Highest education level: Not on file  Occupational History   Not on file  Tobacco Use   Smoking status: Never   Smokeless tobacco: Never  Vaping Use   Vaping Use: Never used  Substance and Sexual Activity   Alcohol use: Yes    Alcohol/week: 2.0 standard drinks    Types: 2 Glasses of wine per week   Drug use: Never   Sexual activity: Not Currently  Other Topics Concern   Not on file   Social History Narrative   Not on file   Social Determinants of Health   Financial Resource Strain: Not on file  Food Insecurity: Not on file  Transportation Needs: Not on file  Physical Activity: Not on file  Stress: Not on file  Social Connections: Not on file  Intimate Partner Violence: Not on file  Patient is a retired Financial controller Family History  Problem Relation Age of Onset   Diabetes  Mother    AAA (abdominal aortic aneurysm) Father    Cancer Father        lung   Diabetes Maternal Uncle    AAA (abdominal aortic aneurysm) Paternal Grandfather    Colon polyps Brother    Colon cancer Neg Hx    Esophageal cancer Neg Hx    Rectal cancer Neg Hx    Stomach cancer Neg Hx       Review of Systems     Objective:   Physical Exam Constitutional:      Appearance: He is obese.  Cardiovascular:     Rate and Rhythm: Normal rate and regular rhythm.     Heart sounds: Normal heart sounds.  Pulmonary:     Effort: Pulmonary effort is normal.     Breath sounds: Normal breath sounds.  Skin:    Findings: No erythema or rash.  Neurological:     Mental Status: He is alert.    Chronic venous stasis changes on both legs left greater than right with varicose veins and trace pitting edema      Assessment & Plan:  Type 2 diabetes mellitus without complication, without long-term current use of insulin (HCC) - Plan: COMPLETE METABOLIC PANEL WITH GFR, Lipid panel, Hemoglobin A1c, Microalbumin, urine  Thoracic aortic aneurysm without rupture (HCC)  Benign essential HTN  Pure hypercholesterolemia Return fasting to check a fasting lipid panel, hemoglobin A1c, CMP, and urine microalbumin.  I congratulated the patient on his weight loss.  His blood pressure today is well controlled.  His blood sugars sound well controlled and if his A1c is below 6.5, he does not require medication at this time and he is adamant that he wants to try lifestyle changes.  However I disagree with the  patient about the statin and explained to him but they recommend a statin regardless of the cholesterol for diabetic for cardiovascular protection.  Despite that, he refuses to take a statin politely.

## 2020-10-25 NOTE — Addendum Note (Signed)
Addended by: Phillips Odor on: 10/25/2020 03:07 PM   Modules accepted: Orders

## 2020-10-26 ENCOUNTER — Ambulatory Visit (INDEPENDENT_AMBULATORY_CARE_PROVIDER_SITE_OTHER): Payer: MEDICARE | Admitting: Neurology

## 2020-10-26 DIAGNOSIS — G4733 Obstructive sleep apnea (adult) (pediatric): Secondary | ICD-10-CM | POA: Diagnosis not present

## 2020-10-26 DIAGNOSIS — Z9189 Other specified personal risk factors, not elsewhere classified: Secondary | ICD-10-CM

## 2020-10-26 DIAGNOSIS — G4730 Sleep apnea, unspecified: Secondary | ICD-10-CM

## 2020-10-26 DIAGNOSIS — Z8669 Personal history of other diseases of the nervous system and sense organs: Secondary | ICD-10-CM

## 2020-10-26 DIAGNOSIS — E119 Type 2 diabetes mellitus without complications: Secondary | ICD-10-CM

## 2020-10-26 DIAGNOSIS — K089 Disorder of teeth and supporting structures, unspecified: Secondary | ICD-10-CM

## 2020-10-26 DIAGNOSIS — Z6841 Body Mass Index (BMI) 40.0 and over, adult: Secondary | ICD-10-CM

## 2020-10-26 DIAGNOSIS — R0683 Snoring: Secondary | ICD-10-CM

## 2020-10-31 NOTE — Progress Notes (Signed)
[facility] SLEEP TEST REPORT ( by Watch PAT)   STUDY DATA  loaded:  10-31-2020 DOB:  February 19, 1955 MRN: [id number]   ORDERING CLINICIAN: Melvyn Novas, MD  REFERRING CLINICIAN: Lynnea Ferrier, MD   CLINICAL INFORMATION/HISTORY:  08/23/2020 Sleep Apnea evaluation.  Chief concern according to patient :  States he had a PSG 20 years ago and was told that he had sleep apnea but was never treated.  He refused treatment as he didn't feel his symptoms matched the diagnosis, he felt he just snored, he wasn't sleepy. This study was while living in [place]. Patient states he gets about 10 hours of sleep and took a nap during day ever since he was a kid. 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 He snores "forever"  in sleep but his wife has confirmed that she witnessed apnea events   Epworth sleepiness score: 05/24.   BMI: 46 kg/m   Neck Circumference: 22"   FINDINGS:   Sleep Summary:   Total Recording Time (hours, min): The total recording time amounted to 8 hours and 24 minutes of which 7 hours and 34 minutes with a calculated overall sleep time.  Calculated REM sleep proportion of sleep time was 14.2%.                             Respiratory Indices: The overall apnea hypopnea index per hour of sleep was calculated at 81.9.  During REM sleep this amounted to 49.7/h and in non-REM sleep 87.3/h.  554 apneas with more than 4% oxygen desaturation were counted.  Sleep position did not influence the severity of apnea.  In supine sleep there was an AHI of 79.2/h versus a nonsupine 84.1/h. Snoring level was loud with a mean volume of 45 dB.                                                     Oxygen Saturation Statistics: Oxygen saturations varied between a nadir of 60% and a maximum of 97% with a mean oxygenation of 86%.  Time at or below 88% saturation defined as hypoxia time was 250 minutes of the equivalent of 55% of total  sleep time.   O2 Saturation (minutes) <89%:  250 minutes !         Pulse Rate Statistics:   Pulse Range: Pulse ranged with a minimum of 40 and a maximum 103 bpm had a mean heart rate of 72 bpm               IMPRESSION:  This HST confirms the presence of severe sleep apnea with an unusual distribution between non-REM sleep and REM sleep.  However central sleep apnea cannot be excluded.  In association with severe hypoxia this patient should probably have an attended sleep study to titrate to CPAP and to see if still additional oxygen supplementation is needed.  If this is not possible from an insurance standpoint I would order an auto CPAP but I am concerned that hypoxemia may persist. Patients with high apnea levels and higher pressure needs often do better with BiPAP than CPAP, but this would also require an inpatient titration study to determine.   RECOMMENDATION:In association with  severe hypoxia this patient should probably have an attended sleep study to titrate to CPAP and to see if still additional oxygen supplementation is needed.  If this is not possible from an insurance standpoint I would order an auto CPAP but I am concerned that hypoxemia may persist. Patients with high apnea levels and higher pressure needs often do better with BiPAP than CPAP, but this would also require an inpatient titration study to determine.  Plan B is autotitration CPAP 7-20 cm water, 3 cm EPR, mask of his choice to be fitted in person and heated humidification of machine and tubing.     INTERPRETING PHYSICIAN:   Carmen Dohmeier, MD   Medical Director of [facility].                    

## 2020-11-12 ENCOUNTER — Encounter: Payer: Self-pay | Admitting: Neurology

## 2020-11-13 DIAGNOSIS — G4730 Sleep apnea, unspecified: Secondary | ICD-10-CM | POA: Insufficient documentation

## 2020-11-13 DIAGNOSIS — G4733 Obstructive sleep apnea (adult) (pediatric): Secondary | ICD-10-CM | POA: Insufficient documentation

## 2020-11-13 DIAGNOSIS — Z9189 Other specified personal risk factors, not elsewhere classified: Secondary | ICD-10-CM | POA: Insufficient documentation

## 2020-11-13 NOTE — Addendum Note (Signed)
Addended by: Melvyn Novas on: 11/13/2020 06:03 PM   Modules accepted: Orders

## 2020-11-13 NOTE — Procedures (Signed)
[facility] SLEEP TEST REPORT ( by Watch PAT)   STUDY DATA  loaded:  10-31-2020 DOB:  October 29, 1955 MRN: [id number]   ORDERING CLINICIAN: Melvyn Novas, MD  REFERRING CLINICIAN: Lynnea Ferrier, MD   CLINICAL INFORMATION/HISTORY:  08/23/2020 Sleep Apnea evaluation.  Chief concern according to patient :  States he had a PSG 20 years ago and was told that he had sleep apnea but was never treated.  He refused treatment as he didn't feel his symptoms matched the diagnosis, he felt he just snored, he wasn't sleepy. This study was while living in [place]. Patient states he gets about 10 hours of sleep and took a nap during day ever since he was a kid. 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 He snores "forever"  in sleep but his wife has confirmed that she witnessed apnea events   Epworth sleepiness score: 05/24.   BMI: 46 kg/m   Neck Circumference: 22"   FINDINGS:   Sleep Summary:   Total Recording Time (hours, min): The total recording time amounted to 8 hours and 24 minutes of which 7 hours and 34 minutes with a calculated overall sleep time.  Calculated REM sleep proportion of sleep time was 14.2%.                             Respiratory Indices: The overall apnea hypopnea index per hour of sleep was calculated at 81.9.  During REM sleep this amounted to 49.7/h and in non-REM sleep 87.3/h.  554 apneas with more than 4% oxygen desaturation were counted.  Sleep position did not influence the severity of apnea.  In supine sleep there was an AHI of 79.2/h versus a nonsupine 84.1/h. Snoring level was loud with a mean volume of 45 dB.                                                     Oxygen Saturation Statistics: Oxygen saturations varied between a nadir of 60% and a maximum of 97% with a mean oxygenation of 86%.  Time at or below 88% saturation defined as hypoxia time was 250 minutes of the equivalent of 55% of total sleep  time.   O2 Saturation (minutes) <89%:  250 minutes !         Pulse Rate Statistics:   Pulse Range: Pulse ranged with a minimum of 40 and a maximum 103 bpm had a mean heart rate of 72 bpm               IMPRESSION:  This HST confirms the presence of severe sleep apnea with an unusual distribution between non-REM sleep and REM sleep.  However central sleep apnea cannot be excluded.  In association with severe hypoxia this patient should probably have an attended sleep study to titrate to CPAP and to see if still additional oxygen supplementation is needed.  If this is not possible from an insurance standpoint I would order an auto CPAP but I am concerned that hypoxemia may persist. Patients with high apnea levels and higher pressure needs often do better with BiPAP than CPAP, but this would also require an inpatient titration study to determine.   RECOMMENDATION:In association with the noted  severe hypoxia this patient should probably have an attended sleep study to titrate to CPAP and to see if still additional oxygen supplementation is needed.  If this is not possible from an insurance standpoint I would order an auto CPAP but I am concerned that hypoxemia may persist. Patients with high apnea levels and higher pressure needs often do better with BiPAP than CPAP, but this would also require an inpatient titration study to determine.  Plan B is autotitration CPAP 7-20 cm water, 3 cm EPR, mask of his choice to be fitted in person and heated humidification of machine and tubing.     INTERPRETING PHYSICIAN:   Melvyn Novas, MD   Medical Director of [facility] at [facility].

## 2020-11-13 NOTE — Progress Notes (Signed)
Respiratory Indices: The overall apnea hypopnea index per hour of sleep was calculated at 81.9.  During REM sleep this amounted to 49.7/h and in non-REM sleep 87.3/h.  554 apneas with more than 4% oxygen desaturation were counted.  Sleep position did not influence the severity of apnea.  In supine sleep there was an AHI of 79.2/h versus a nonsupine 84.1/h. Snoring level was loud with a mean volume of 45 dB.   Oxygen Saturation Statistics: Oxygen saturations varied between a nadir of 60% and a maximum of 97% with a mean oxygenation of 86%.  Time at or below 88% saturation defined as hypoxia time was 250 minutes of the equivalent of 55% of total sleep time.   O2 Saturation (minutes) <89%:250 minutes ! In association with the noted severe hypoxia this patient should probably have an attended sleep study to titrate to CPAP and to see if still additional oxygen supplementation is needed.  If this is not possible from an insurance standpoint I would order an auto CPAP but I am concerned that hypoxemia may persist. Patients with high apnea levels and higher pressure needs often do better with BiPAP than CPAP, but this would also require an inpatient titration study to determine.  Plan B is autotitration CPAP 7-20 cm water, 3 cm EPR, mask of his choice to be fitted in person and heated humidification of machine and tubing.

## 2020-11-14 NOTE — Telephone Encounter (Addendum)
Called the patient and his wife answered. I advised pt that Dr. Vickey Huger reviewed his sleep study results and found that has severe sleep apnea and recommends that pt be treated with a cpap. Dr. Vickey Huger recommends that pt return for a repeat sleep study in order to properly titrate the cpap and ensure a good mask fit. Pt's wife will review all this with the patient to see if he is agreeable to returning for a titration study. I advised pt that our sleep lab will file with pt's insurance and call pt to schedule the sleep study when we hear back from the pt's insurance regarding coverage of this sleep study. Pt's wife verbalized understanding of results. Pt's wife had no questions at this time but was encouraged to call back if questions arise. I did advise the wife that if titration is not covered under insurance, the next steps would be to complete auto CPAP and possibly ONO on CPAP. Advised I would send a mychart message if the auto CPAP is what insurance requires. She verbalized understanding.  ----- Message from Melvyn Novas, MD sent at 11/13/2020  6:03 PM EDT ----- Respiratory Indices: The overall apnea hypopnea index per hour of sleep was calculated at 81.9.  During REM sleep this amounted to 49.7/h and in non-REM sleep 87.3/h.  554 apneas with more than 4% oxygen desaturation were counted.  Sleep position did not influence the severity of apnea.  In supine sleep there was an AHI of 79.2/h versus a nonsupine 84.1/h. Snoring level was loud with a mean volume of 45 dB.                                                     Oxygen Saturation Statistics: Oxygen saturations varied between a nadir of 60% and a maximum of 97% with a mean oxygenation of 86%.  Time at or below 88% saturation defined as hypoxia time was 250 minutes of the equivalent of 55% of total sleep time.   O2 Saturation (minutes) <89%:  250 minutes !    In association with the noted severe hypoxia this patient should probably have an  attended sleep study to titrate to CPAP and to see if still additional oxygen supplementation is needed.  If this is not possible from an insurance standpoint I would order an auto CPAP but I am concerned that hypoxemia may persist. Patients with high apnea levels and higher pressure needs often do better with BiPAP than CPAP, but this would also require an inpatient titration study to determine.   Plan B is autotitration CPAP 7-20 cm water, 3 cm EPR, mask of his choice to be fitted in person and heated humidification of machine and tubing.

## 2020-12-12 ENCOUNTER — Telehealth: Payer: Self-pay

## 2020-12-12 NOTE — Telephone Encounter (Signed)
LVM for pt to call me back to schedule sleep study  

## 2020-12-19 ENCOUNTER — Telehealth: Payer: Self-pay | Admitting: Neurology

## 2020-12-19 NOTE — Telephone Encounter (Signed)
We have attempted to call the patient 2 times to schedule sleep study. Patient has been unavailable at the phone numbers we have on file and has not returned our calls. If patient calls back we will schedule them for their sleep study. ° °

## 2021-02-13 ENCOUNTER — Encounter: Payer: Self-pay | Admitting: Family Medicine

## 2021-02-17 ENCOUNTER — Other Ambulatory Visit: Payer: Self-pay | Admitting: Thoracic Surgery (Cardiothoracic Vascular Surgery)

## 2021-02-17 DIAGNOSIS — I7121 Aneurysm of the ascending aorta, without rupture: Secondary | ICD-10-CM

## 2021-03-28 ENCOUNTER — Other Ambulatory Visit: Payer: Self-pay

## 2021-03-28 ENCOUNTER — Encounter: Payer: Self-pay | Admitting: Thoracic Surgery (Cardiothoracic Vascular Surgery)

## 2021-03-28 ENCOUNTER — Ambulatory Visit (INDEPENDENT_AMBULATORY_CARE_PROVIDER_SITE_OTHER): Payer: MEDICARE | Admitting: Thoracic Surgery (Cardiothoracic Vascular Surgery)

## 2021-03-28 ENCOUNTER — Ambulatory Visit
Admission: RE | Admit: 2021-03-28 | Discharge: 2021-03-28 | Disposition: A | Discharge status: Home / Self Care | Payer: MEDICARE | Source: Ambulatory Visit | Attending: Thoracic Surgery (Cardiothoracic Vascular Surgery) | Admitting: Thoracic Surgery (Cardiothoracic Vascular Surgery)

## 2021-03-28 VITALS — BP 122/63 | HR 80 | Resp 20 | Ht 73.0 in | Wt 342.0 lb

## 2021-03-28 DIAGNOSIS — I7121 Aneurysm of the ascending aorta, without rupture: Secondary | ICD-10-CM | POA: Diagnosis not present

## 2021-03-28 NOTE — Progress Notes (Signed)
[address]       [place] [address]             [phone]       HPI: Aaron Duncan returns for follow-up of his ascending aneurysm  Aaron Duncan is a 66 year old man with a past history significant for morbid obesity, hypertension, hyperlipidemia, sleep apnea, hepatic steatosis, and degenerative disc disease.  He was found to have a 4.6 cm ascending aortic aneurysm last summer.  In the interim since his last visit he has not had any additional weight loss and says he may have gained a few pounds back.  He had lost 40 pounds leading up to that visit.  He is not having any chest pain, pressure, or tightness.  He does monitor his blood sugars at home and says they have been in good control.  He is also monitoring his blood pressure which has been consistently below 130 systolic.  Past Medical History:  Diagnosis Date   Ascending aortic aneurysm 09/02/2020   4.6 cm by CT   DDD (degenerative disc disease), lumbosacral    Hepatic steatosis    Hyperlipidemia    Hypertension    Morbid obesity (HCC)    Sleep apnea     Current Outpatient Medications  Medication Sig Dispense Refill   amLODipine (NORVASC) 5 MG tablet Take 1 tablet (5 mg total) by mouth daily. 90 tablet 3   cholecalciferol (VITAMIN D3) 25 MCG (1000 UNIT) tablet Take 1,000 Units by mouth daily.     Ginger, Zingiber officinalis, (GINGER PO) Take by mouth.     losartan-hydrochlorothiazide (HYZAAR) 100-25 MG tablet TAKE 1 TABLET DAILY 90 tablet 3   magnesium 30 MG tablet Take 30 mg by mouth 2 (two) times daily.     Multiple Vitamin (MULTIVITAMIN WITH MINERALS) TABS tablet Take 1 tablet by mouth daily.     OVER THE COUNTER MEDICATION CBD Oil     pyridOXINE (VITAMIN B-6) 100 MG tablet Take 100 mg by mouth daily.     TURMERIC PO Take by mouth.     vitamin B-12 (CYANOCOBALAMIN) 100 MCG tablet Take 100 mcg by mouth daily.     clotrimazole-betamethasone (LOTRISONE) cream Apply 1 application topically 2 (two) times  daily. (Patient not taking: Reported on 03/28/2021) 30 g 1   metFORMIN (GLUCOPHAGE XR) 500 MG 24 hr tablet Take 2 tablets (1,000 mg total) by mouth daily with breakfast. 180 tablet 3   No current facility-administered medications for this visit.    Physical Exam BP 122/63    Pulse 80    Resp 20    Ht 6\' 1"  (1.854 m)    Wt (!) 342 lb (155.1 kg)    SpO2 97% Comment: RA   BMI 45.12 kg/m  morbidly obese 66 year old man in no acute distress Alert and oriented x3 with no focal deficits Cardiac regular rate and rhythm, no murmur No carotid bruits Lungs clear bilaterally  Diagnostic Tests: CT CHEST WITHOUT CONTRAST   TECHNIQUE: Multidetector CT imaging of the chest was performed following the standard protocol without IV contrast.   RADIATION DOSE REDUCTION: This exam was performed according to the departmental dose-optimization program which includes automated exposure control, adjustment of the mA and/or kV according to patient size and/or use of iterative reconstruction technique.   COMPARISON:  CT 09/02/2020   FINDINGS: Cardiovascular: The ascending thoracic aorta measures 4.7 x 4.7 cm (image 109/series 10/axial) compared to 4.6 x 4.4 cm on comparison exam. Aorta  measured at the level of the pulmonary outflow tract matching comparison.   Aorta measures 4.5 cm in oblique sagittal plane.   Coronary artery calcification and aortic atherosclerotic calcification.   Mediastinum/Nodes: No axillary or supraclavicular adenopathy. No mediastinal or hilar adenopathy. No pericardial fluid. Esophagus normal.   Lungs/Pleura: No suspicious pulmonary nodules. Normal pleural. Airways normal.   Upper Abdomen: Benign hepatic cysts.  Adrenal glands normal.   Musculoskeletal: No aggressive osseous lesion. Degenerative osteophytosis of the spine.   IMPRESSION: 1. Measured in same orientation as comparison exam ascending thoracic aorta measures 4.7 cm. 2. Coronary artery calcification and  Aortic Atherosclerosis (ICD10-I70.0).     Electronically Signed   By: Genevive Bi M.D.   On: 03/28/2021 12:53 I personally reviewed the CT images.  There is a 4.6 to 4.7 cm ascending aneurysm.  Impression: Aaron Duncan is a 66 year old man with a past history significant for morbid obesity, hypertension, hyperlipidemia, sleep apnea, hepatic steatosis, and degenerative disc disease.  He was found to have a 4.6 cm ascending aortic aneurysm last summer.  Ascending thoracic aortic aneurysm with thoracic aortic atherosclerosis-stable to very minimally increased.  Needs continued semiannual follow-up.  Importance of blood pressure control was again emphasized.  Hypertension-blood pressure well controlled on current regimen.  Atherosclerotic cardiovascular disease-was prescribed Lipitor at 1 time but felt poorly after taking it so he is no longer taking that medication.  Morbid obesity-importance of weight loss for his overall health and wellbeing was emphasized.  Plan: Return in 6 months with CT chest  Loreli Slot, MD  [facility] [phone]

## 2021-05-12 ENCOUNTER — Other Ambulatory Visit: Payer: Self-pay | Admitting: Family Medicine

## 2021-06-26 ENCOUNTER — Other Ambulatory Visit: Payer: Self-pay | Admitting: Family Medicine

## 2021-06-27 NOTE — Telephone Encounter (Signed)
Requested medication (s) are due for refill today: yes  Requested medication (s) are on the active medication list: yes  Last refill:  07/27/20 #180/3  Future visit scheduled: yes scheduled for 08/17/21 at 2:30  Notes to clinic:  pt is overdue for labs and appt. Scheduled for appt. Please assess for refill.      Requested Prescriptions  Pending Prescriptions Disp Refills   metFORMIN (GLUCOPHAGE-XR) 500 MG 24 hr tablet [Pharmacy Med Name: METFORMIN HCL ER TABS 500MG] 180 tablet 3    Sig: TAKE 2 TABLETS DAILY WITH BREAKFAST     Endocrinology:  Diabetes - Biguanides Failed - 06/26/2021 12:59 AM      Failed - Cr in normal range and within 360 days    Creat  Date Value Ref Range Status  06/02/2020 0.84 0.70 - 1.25 mg/dL Final    Comment:    For patients >71 years of age, the reference limit for Creatinine is approximately 13% higher for people identified as African-American. .          Failed - HBA1C is between 0 and 7.9 and within 180 days    Hgb A1c MFr Bld  Date Value Ref Range Status  06/02/2020 8.4 (H) <5.7 % of total Hgb Final    Comment:    For someone without known diabetes, a hemoglobin A1c value of 6.5% or greater indicates that they may have  diabetes and this should be confirmed with a follow-up  test. . For someone with known diabetes, a value <7% indicates  that their diabetes is well controlled and a value  greater than or equal to 7% indicates suboptimal  control. A1c targets should be individualized based on  duration of diabetes, age, comorbid conditions, and  other considerations. . Currently, no consensus exists regarding use of hemoglobin A1c for diagnosis of diabetes for children. .          Failed - eGFR in normal range and within 360 days    GFR, Est African American  Date Value Ref Range Status  06/02/2020 106 > OR = 60 mL/min/1.45m Final   GFR, Est Non African American  Date Value Ref Range Status  06/02/2020 92 > OR = 60 mL/min/1.751m Final         Failed - B12 Level in normal range and within 720 days    No results found for: VITAMINB12       Failed - Valid encounter within last 6 months    Recent Outpatient Visits           8 months ago Type 2 diabetes mellitus without complication, without long-term current use of insulin ([facility]  [place]   10 months ago New onset type 2 diabetes mellitus ([facility]  [facility] [place], [place]   1 year ago New onset type 2 diabetes mellitus ([facility]  [place] [place]   [facility]  [facility] [place]   3 years ago Encounter to establish care with new doctor   [facility], [place]       Future Appointments             In 1 month [place], [place] [facility] [facility]             Failed - CBC within normal limits  and completed in the last 12 months    WBC  Date Value Ref Range Status  06/02/2020 8.7 3.8 - 10.8 Thousand/uL Final   RBC  Date Value Ref Range Status  06/02/2020 5.48 4.20 - 5.80 Million/uL Final   Hemoglobin  Date Value Ref Range Status  06/02/2020 17.0 13.2 - 17.1 g/dL Final   HCT  Date Value Ref Range Status  06/02/2020 51.2 (H) 38.5 - 50.0 % Final   MCHC  Date Value Ref Range Status  06/02/2020 33.2 32.0 - 36.0 g/dL Final   [facility]  Date Value Ref Range Status  06/02/2020 31.0 27.0 - 33.0 pg Final   MCV  Date Value Ref Range Status  06/02/2020 93.4 80.0 - 100.0 fL Final   No results found for: PLTCOUNTKUC, LABPLAT, POCPLA RDW  Date Value Ref Range Status  06/02/2020 12.3 11.0 - 15.0 % Final

## 2021-07-17 ENCOUNTER — Other Ambulatory Visit: Payer: Self-pay | Admitting: Family Medicine

## 2021-07-18 NOTE — Telephone Encounter (Signed)
Pt has future OV, will give courtesy refill until OV.  Requested Prescriptions  Pending Prescriptions Disp Refills  . amLODipine (NORVASC) 5 MG tablet [Pharmacy Med Name: AMLODIPINE BESYLATE TABS 5MG ] 90 tablet 3    Sig: TAKE 1 TABLET DAILY     Cardiovascular: Calcium Channel Blockers 2 Failed - 07/18/2021  8:29 AM      Failed - Valid encounter within last 6 months    Recent Outpatient Visits          8 months ago Type 2 diabetes mellitus without complication, without long-term current use of insulin ([place])   [place] Pickard, Cammie Mcgee, MD   10 months ago New onset type 2 diabetes mellitus ([place])   [place] Pickard, Cammie Mcgee, MD   1 year ago New onset type 2 diabetes mellitus ([place])   [place] Pickard, Cammie Mcgee, MD   1 year ago Morbid obesity [facility])   [place] Pickard, Cammie Mcgee, MD   3 years ago Encounter to establish care with new doctor   [facility], Cammie Mcgee, MD      Future Appointments            In 1 month Pickard, Cammie Mcgee, MD [place], [facility]           Passed - Last BP in normal range    BP Readings from Last 1 Encounters:  03/28/21 122/63         Passed - Last Heart Rate in normal range    Pulse Readings from Last 1 Encounters:  03/28/21 80

## 2021-08-07 ENCOUNTER — Other Ambulatory Visit: Payer: Self-pay | Admitting: *Deleted

## 2021-08-07 DIAGNOSIS — I7121 Aneurysm of the ascending aorta, without rupture: Secondary | ICD-10-CM

## 2021-08-17 ENCOUNTER — Ambulatory Visit: Payer: Self-pay | Admitting: Family Medicine

## 2021-08-31 ENCOUNTER — Ambulatory Visit (INDEPENDENT_AMBULATORY_CARE_PROVIDER_SITE_OTHER): Payer: MEDICARE | Admitting: Family Medicine

## 2021-08-31 VITALS — BP 130/82 | HR 90 | Temp 98.3°F | Ht 73.0 in | Wt 340.0 lb

## 2021-08-31 DIAGNOSIS — E7800 Pure hypercholesterolemia, unspecified: Secondary | ICD-10-CM

## 2021-08-31 DIAGNOSIS — E1169 Type 2 diabetes mellitus with other specified complication: Secondary | ICD-10-CM | POA: Diagnosis not present

## 2021-08-31 DIAGNOSIS — I10 Essential (primary) hypertension: Secondary | ICD-10-CM

## 2021-08-31 MED ORDER — AMLODIPINE BESYLATE 5 MG PO TABS: 5.0000 mg | ORAL_TABLET | Freq: Every day | ORAL | 3 refills | Status: DC

## 2021-08-31 NOTE — Progress Notes (Signed)
Subjective:    Patient ID: Aaron Duncan, male    DOB: [birth year], 66 y.o.   MRN: [id number]  HPI  10/25/20 Wt Readings from Last 3 Encounters:  03/28/21 (!) 342 lb (155.1 kg)  10/25/20 (!) 328 lb (148.8 kg)  09/07/20 (!) 350 lb (158.8 kg)  Since I last saw the patient, he stopped taking metformin.  He stopped taking Lipitor.  However he worked extremely hard on his diet and has lost 22 pounds!  He states that his blood pressure is running between 100-150.  He never sees the blood sugar above 150 even after eating.  He denies any hypoglycemic episodes.  He denies any chest pain shortness of breath or dyspnea on exertion.  He denies any neuropathy in his feet.  However he does not want to take a statin.  We discussed the cardiovascular protective benefits of statins and he declines them.  He is due for fasting lab work.  At that time, my plan was: Return fasting to check a fasting lipid panel, hemoglobin A1c, CMP, and urine microalbumin.  I congratulated the patient on his weight loss.  His blood pressure today is well controlled.  His blood sugars sound well controlled and if his A1c is below 6.5, he does not require medication at this time and he is adamant that he wants to try lifestyle changes.  However I disagree with the patient about the statin and explained to him but they recommend a statin regardless of the cholesterol for diabetic for cardiovascular protection.  Despite that, he refuses to take a statin politely.  08/31/21 Patient has not been seen since September of last year.  He is long overdue for fasting lab work.  His last blood work was in April.  He never returned for repeat blood work.  Weight has increased to 340 from 328.  Patient states that he has not checked his blood sugar in several months.  He has not been taking the metformin in several months.  Therefore his sugars today is reflection of his glycemic control through diet and lifestyle alone.  He denies any polydipsia but he  does report polyuria.  He denies any chest pain.  He does complain of severe pain in his lower back.  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  He has constant lower back pain that he states worsens around 4:00 in the afternoon the longer he is on his feet.  He questions if there is anything that can be done to help for that.  I believe significant weight loss will be his best option sort of surgery.  We again discussed Ozempic.  Past Medical History:  Diagnosis Date   Ascending aortic aneurysm 09/02/2020   4.6 cm by CT   DDD (degenerative disc disease), lumbosacral    Hepatic steatosis    Hyperlipidemia    Hypertension    Morbid obesity (HCC)    Sleep apnea    Past Surgical History:  Procedure Laterality Date   right elbow tendonitis     work related injury   SKIN CANCER EXCISION Right 2019   basal cell ca R side of face    Current Outpatient Medications on File Prior to Visit  Medication Sig Dispense Refill   amLODipine (NORVASC) 5 MG tablet TAKE 1 TABLET DAILY 30 tablet 0   cholecalciferol (VITAMIN D3) 25 MCG (1000 UNIT) tablet Take 1,000 Units by mouth daily.  clotrimazole-betamethasone (LOTRISONE) cream Apply 1 application topically 2 (two) times daily. (Patient not taking: Reported on 03/28/2021) 30 g 1   Ginger, Zingiber officinalis, (GINGER PO) Take by mouth.     losartan-hydrochlorothiazide (HYZAAR) 100-25 MG tablet TAKE 1 TABLET DAILY 90 tablet 3   magnesium 30 MG tablet Take 30 mg by mouth 2 (two) times daily.     metFORMIN (GLUCOPHAGE-XR) 500 MG 24 hr tablet TAKE 2 TABLETS DAILY WITH BREAKFAST 180 tablet 3   Multiple Vitamin (MULTIVITAMIN WITH MINERALS) TABS tablet Take 1 tablet by mouth daily.     OVER THE COUNTER MEDICATION CBD Oil     pyridOXINE (VITAMIN B-6) 100 MG tablet Take 100 mg by mouth daily.     TURMERIC PO Take by mouth.     vitamin B-12 (CYANOCOBALAMIN) 100 MCG tablet Take 100 mcg  by mouth daily.     No current facility-administered medications on file prior to visit.    No Known Allergies Social History   Socioeconomic History   Marital status: Married    Spouse name: Not on file   Number of children: Not on file   Years of education: Not on file   Highest education level: Not on file  Occupational History   Not on file  Tobacco Use   Smoking status: Never   Smokeless tobacco: Never  Vaping Use   Vaping Use: Never used  Substance and Sexual Activity   Alcohol use: Yes    Alcohol/week: 2.0 standard drinks of alcohol    Types: 2 Glasses of wine per week   Drug use: Never   Sexual activity: Not Currently  Other Topics Concern   Not on file  Social History Narrative   Not on file   Social Determinants of Health   Financial Resource Strain: Not on file  Food Insecurity: Not on file  Transportation Needs: Not on file  Physical Activity: Not on file  Stress: Not on file  Social Connections: Not on file  Intimate Partner Violence: Not on file  Patient is a retired Financial controller Family History  Problem Relation Age of Onset   Diabetes Mother    AAA (abdominal aortic aneurysm) Father    Cancer Father        lung   Diabetes Maternal Uncle    AAA (abdominal aortic aneurysm) Paternal Grandfather    Colon polyps Brother    Colon cancer Neg Hx    Esophageal cancer Neg Hx    Rectal cancer Neg Hx    Stomach cancer Neg Hx       Review of Systems     Objective:   Physical Exam Constitutional:      Appearance: He is obese.  Cardiovascular:     Rate and Rhythm: Normal rate and regular rhythm.     Heart sounds: Normal heart sounds.  Pulmonary:     Effort: Pulmonary effort is normal.     Breath sounds: Normal breath sounds.  Musculoskeletal:     Lumbar back: Tenderness and bony tenderness present. Decreased range of motion.  Skin:    Findings: No erythema or rash.  Neurological:     Mental Status: He is alert.         Assessment &  Plan:  Type 2 diabetes mellitus with other specified complication, without long-term current use of insulin (HCC) - Plan: CBC with Differential/Platelet, Lipid panel, Microalbumin, urine, COMPLETE METABOLIC PANEL WITH GFR, Hemoglobin A1c  Benign essential HTN  Pure hypercholesterolemia  Patient's blood pressure  today is acceptable but I am concerned that his sugars are out of control.  If his A1c is elevated I believe he would be an excellent candidate for a GLP-1 agonist.  This may help with weight loss in addition to helping to control his sugars.  I explained to the patient that with substantial weight loss I do believe a lot of the pain in his back would improve.  Therefore I believe that that medication can help him on several fronts.  I discussed a referral to a neurosurgeon but at the present time he is hesitant due to articles he has read regarding AI possibly replacing the need for surgery in the future.

## 2021-08-31 NOTE — Addendum Note (Signed)
Addended by: Arta Silence on: 08/31/2021 03:25 PM   Modules accepted: Orders

## 2021-09-01 ENCOUNTER — Other Ambulatory Visit: Payer: Self-pay

## 2021-09-01 ENCOUNTER — Encounter: Payer: Self-pay | Admitting: Family Medicine

## 2021-09-01 DIAGNOSIS — I10 Essential (primary) hypertension: Secondary | ICD-10-CM

## 2021-09-01 LAB — COMPLETE METABOLIC PANEL WITH GFR
AG Ratio: 2 (calc) (ref 1.0–2.5)
ALT: 30 U/L (ref 9–46)
AST: 28 U/L (ref 10–35)
Albumin: 4.8 g/dL (ref 3.6–5.1)
Alkaline phosphatase (APISO): 82 U/L (ref 35–144)
BUN: 19 mg/dL (ref 7–25)
CO2: 25 mmol/L (ref 20–32)
Calcium: 9.8 mg/dL (ref 8.6–10.3)
Chloride: 103 mmol/L (ref 98–110)
Creat: 0.83 mg/dL (ref 0.70–1.35)
Globulin: 2.4 g/dL (calc) (ref 1.9–3.7)
Glucose, Bld: 128 mg/dL — ABNORMAL HIGH (ref 65–99)
Potassium: 3.9 mmol/L (ref 3.5–5.3)
Sodium: 138 mmol/L (ref 135–146)
Total Bilirubin: 0.7 mg/dL (ref 0.2–1.2)
Total Protein: 7.2 g/dL (ref 6.1–8.1)
eGFR: 97 mL/min/{1.73_m2} (ref >=60)

## 2021-09-01 LAB — MICROALBUMIN, URINE: Microalb, Ur: 4.4 mg/dL

## 2021-09-01 LAB — CBC WITH DIFFERENTIAL/PLATELET
Absolute Monocytes: 893 cells/uL (ref 200–950)
Basophils Absolute: 68 cells/uL (ref 0–200)
Basophils Relative: 0.8 %
Eosinophils Absolute: 247 cells/uL (ref 15–500)
Eosinophils Relative: 2.9 %
HCT: 47.8 % (ref 38.5–50.0)
Hemoglobin: 16.6 g/dL (ref 13.2–17.1)
Lymphs Abs: 2797 cells/uL (ref 850–3900)
MCH: 32.2 pg (ref 27.0–33.0)
MCHC: 34.7 g/dL (ref 32.0–36.0)
MCV: 92.6 fL (ref 80.0–100.0)
MPV: 11.2 fL (ref 7.5–12.5)
Monocytes Relative: 10.5 %
Neutro Abs: 4497 cells/uL (ref 1500–7800)
Neutrophils Relative %: 52.9 %
Platelets: 243 10*3/uL (ref 140–400)
RBC: 5.16 10*6/uL (ref 4.20–5.80)
RDW: 12.8 % (ref 11.0–15.0)
Total Lymphocyte: 32.9 %
WBC: 8.5 10*3/uL (ref 3.8–10.8)

## 2021-09-01 LAB — HEMOGLOBIN A1C
Hgb A1c MFr Bld: 6.5 % of total Hgb — ABNORMAL HIGH (ref <5.7)
Mean Plasma Glucose: 140 mg/dL
eAG (mmol/L): 7.7 mmol/L

## 2021-09-01 LAB — LIPID PANEL
Cholesterol: 175 mg/dL (ref <200)
HDL: 42 mg/dL (ref >=40)
LDL Cholesterol (Calc): 111 mg/dL (calc) — ABNORMAL HIGH
Non-HDL Cholesterol (Calc): 133 mg/dL (calc) — ABNORMAL HIGH (ref <130)
Total CHOL/HDL Ratio: 4.2 (calc) (ref <5.0)
Triglycerides: 111 mg/dL (ref <150)

## 2021-09-01 MED ORDER — OZEMPIC (0.25 OR 0.5 MG/DOSE) 2 MG/3ML ~~LOC~~ SOPN: 2.0000 mg | PEN_INJECTOR | SUBCUTANEOUS | 0 refills | Status: DC

## 2021-09-01 MED ORDER — AMLODIPINE BESYLATE 5 MG PO TABS: 5.0000 mg | ORAL_TABLET | Freq: Every day | ORAL | 3 refills | Status: DC

## 2021-09-05 ENCOUNTER — Other Ambulatory Visit: Payer: Self-pay

## 2021-09-05 MED ORDER — OZEMPIC (0.25 OR 0.5 MG/DOSE) 2 MG/3ML ~~LOC~~ SOPN: PEN_INJECTOR | SUBCUTANEOUS | 0 refills | Status: DC

## 2021-09-11 ENCOUNTER — Other Ambulatory Visit: Payer: Self-pay

## 2021-09-11 MED ORDER — OZEMPIC (0.25 OR 0.5 MG/DOSE) 2 MG/3ML ~~LOC~~ SOPN: PEN_INJECTOR | SUBCUTANEOUS | 0 refills | Status: DC

## 2021-09-26 ENCOUNTER — Ambulatory Visit (INDEPENDENT_AMBULATORY_CARE_PROVIDER_SITE_OTHER): Payer: MEDICARE | Admitting: Thoracic Surgery (Cardiothoracic Vascular Surgery)

## 2021-09-26 ENCOUNTER — Encounter: Payer: Self-pay | Admitting: Thoracic Surgery (Cardiothoracic Vascular Surgery)

## 2021-09-26 ENCOUNTER — Ambulatory Visit
Admission: RE | Admit: 2021-09-26 | Discharge: 2021-09-26 | Disposition: A | Discharge status: Home / Self Care | Payer: MEDICARE | Source: Ambulatory Visit | Attending: Thoracic Surgery (Cardiothoracic Vascular Surgery) | Admitting: Thoracic Surgery (Cardiothoracic Vascular Surgery)

## 2021-09-26 VITALS — BP 131/88 | HR 88 | Resp 18 | Ht 73.0 in | Wt 343.0 lb

## 2021-09-26 DIAGNOSIS — I7121 Aneurysm of the ascending aorta, without rupture: Secondary | ICD-10-CM

## 2021-09-26 NOTE — Progress Notes (Signed)
[address]       Aaron Duncan [address]             [phone]     HPI: Aaron Duncan returns for follow-up of his ascending aneurysm.  Aaron Duncan is a 66 year old man with a history of morbid obesity, hypertension, hyperlipidemia, sleep apnea, hepatic steatosis, degenerative disc disease, and an ascending aortic aneurysm.  He was found to have a 4.6 cm ascending aneurysm about a year ago in 2022.  Last saw him in February.  The aneurysm measured around 4.7 cm.  No significant change.  He otherwise was doing well.  He denies any chest pain, pressure, or tightness.  He has not been able to lose any significant weight since his last visit.  Still weighs about 340 pounds.  Has been monitoring his blood pressure.  Typically in the 120s to 130/80-90 range.  Past Medical History:  Diagnosis Date   Ascending aortic aneurysm (HCC) 09/02/2020   4.6 cm by CT   DDD (degenerative disc disease), lumbosacral    Hepatic steatosis    Hyperlipidemia    Hypertension    Morbid obesity (HCC)    Sleep apnea      Current Outpatient Medications  Medication Sig Dispense Refill   amLODipine (NORVASC) 5 MG tablet Take 1 tablet (5 mg total) by mouth daily. 90 tablet 3   cholecalciferol (VITAMIN D3) 25 MCG (1000 UNIT) tablet Take 1,000 Units by mouth daily.     Choline Dihydrogen Citrate (CHOLINE CITRATE PO) Take 3 tablets by mouth daily.     Cyanocobalamin (VITAMIN B 12 PO) Take 1 tablet by mouth daily.     Garlic 10 MG CAPS Take by mouth.     Ginger, Zingiber officinalis, (GINGER PO) Take by mouth.     losartan-hydrochlorothiazide (HYZAAR) 100-25 MG tablet TAKE 1 TABLET DAILY 90 tablet 3   magnesium 30 MG tablet Take 30 mg by mouth 2 (two) times daily.     Multiple Vitamin (MULTIVITAMIN WITH MINERALS) TABS tablet Take 1 tablet by mouth daily.     OVER THE COUNTER MEDICATION CBD Oil     TURMERIC PO Take by mouth.     Semaglutide,0.25 or 0.5MG /DOS, (OZEMPIC, 0.25 OR 0.5 MG/DOSE,) 2  MG/3ML SOPN Inj 0.5mg  into the skin once a week for four weeks (Patient not taking: Reported on 09/26/2021) 3 mL 0   No current facility-administered medications for this visit.    Physical Exam BP 131/88 (BP Location: Left Arm, Patient Position: Sitting)   Pulse 88   Resp 18   Ht 6\' 1"  (1.854 m)   Wt (!) 343 lb (155.6 kg)   SpO2 92% Comment: RA  BMI 45.75 kg/m  66 year old obese man in no acute distress Alert and oriented x3 with no focal deficits Lungs clear Cardiac regular rate and rhythm, no murmur No peripheral edema No carotid bruits  Diagnostic Tests: CT CHEST WITHOUT CONTRAST   TECHNIQUE: Multidetector CT imaging of the chest was performed following the standard protocol without IV contrast.   RADIATION DOSE REDUCTION: This exam was performed according to the departmental dose-optimization program which includes automated exposure control, adjustment of the mA and/or kV according to patient size and/or use of iterative reconstruction technique.   COMPARISON:  03/28/2021   FINDINGS: Cardiovascular: Mid ascending thoracic aorta measures 4.9 x 4.9 cm in diameter (series 4, image 82), previously 4.7 cm. Scattered atherosclerotic vascular calcifications of the aorta and coronary arteries. Main pulmonary trunk  measures 3.2 cm in diameter. Heart size within normal limits. No pericardial effusion.   Mediastinum/Nodes: Mildly prominent precarinal lymph node measuring 11 mm short axis (series 4, image 71). No axillary lymphadenopathy. Evaluation of the hilar structures is limited in the absence of intravenous contrast. Within this limitation, no obvious hilar adenopathy or mass is identified. Thyroid, trachea, and esophagus within normal limits.   Lungs/Pleura: Patchy airspace consolidation within the posteroinferior aspect of the right upper lobe suspicious for pneumonia. Left lung is clear. No pleural effusion or pneumothorax.   Upper Abdomen: No appreciable change  of multiple hepatic cysts. No acute findings within the included upper abdomen.   Musculoskeletal: Degenerative endplate osteophytosis within the thoracic spine. No acute bony findings. No chest wall abnormality.   IMPRESSION: 1. Mid ascending thoracic aortic aneurysm measures up to 4.9 cm in diameter, previously 4.7 cm. 2. Patchy airspace consolidation within the right upper lobe, suspicious for pneumonia. 3. Mildly prominent precarinal lymph node, likely reactive. 4. Aortic and coronary artery atherosclerosis (ICD10-I70.0).     Electronically Signed   By: Duanne Guess D.O.   On: 09/26/2021 15:16   I personally reviewed the CT images.  My measurements were around 4.8 to 4.9 cm.  Slight increase from previous.  Aortic and coronary atherosclerosis.  Impression: Aaron Duncan is a 66 year old man with a history of morbid obesity, hypertension, hyperlipidemia, sleep apnea, hepatic steatosis, degenerative disc disease, and an ascending aortic aneurysm.  He was found to have a 4.6 cm ascending aneurysm about a year ago in 2022.  Ascending aneurysm-slight interval growth of about 1 to 2 mm now measuring around 4.8 to 4.9 cm.  No indication for surgery.  Needs continued semiannual follow-up.  Hypertension-blood pressure slightly elevated today.  Monitoring at home.  Seems well controlled overall.  Continue current regimen.  Morbid obesity-again emphasized importance of weight loss for overall health.  Plan: Return in 6 months with CT angio of chest.  We will do with contrast to get precise measurements given possible interval growth.  Aaron Slot, MD [facility] [phone]

## 2021-12-11 ENCOUNTER — Encounter: Payer: Self-pay | Admitting: Family Medicine

## 2022-02-14 ENCOUNTER — Other Ambulatory Visit: Payer: Self-pay | Admitting: Thoracic Surgery (Cardiothoracic Vascular Surgery)

## 2022-02-14 DIAGNOSIS — I7121 Aneurysm of the ascending aorta, without rupture: Secondary | ICD-10-CM

## 2022-04-03 ENCOUNTER — Ambulatory Visit (INDEPENDENT_AMBULATORY_CARE_PROVIDER_SITE_OTHER): Payer: MEDICARE | Admitting: Thoracic Surgery (Cardiothoracic Vascular Surgery)

## 2022-04-03 ENCOUNTER — Ambulatory Visit
Admission: RE | Admit: 2022-04-03 | Discharge: 2022-04-03 | Disposition: A | Discharge status: Home / Self Care | Payer: MEDICARE | Source: Ambulatory Visit | Attending: Thoracic Surgery (Cardiothoracic Vascular Surgery) | Admitting: Thoracic Surgery (Cardiothoracic Vascular Surgery)

## 2022-04-03 ENCOUNTER — Encounter: Payer: Self-pay | Admitting: Thoracic Surgery (Cardiothoracic Vascular Surgery)

## 2022-04-03 VITALS — BP 139/89 | HR 76 | Resp 18 | Ht 73.0 in | Wt 354.0 lb

## 2022-04-03 DIAGNOSIS — I7121 Aneurysm of the ascending aorta, without rupture: Secondary | ICD-10-CM

## 2022-04-03 MED ORDER — IOPAMIDOL (ISOVUE-370) INJECTION 76%
75.0000 mL | Freq: Once | INTRAVENOUS | Status: AC | PRN
  Administered 2022-04-03: 75 mL via INTRAVENOUS

## 2022-04-03 NOTE — Progress Notes (Signed)
[address]       [place] [address]             [phone]     HPI: [name] returns for follow-up of his ascending aneurysm.  Aaron Duncan is a 67 year old man with history of morbid obesity, hypertension, hyperlipidemia, sleep apnea, degenerative disc disease, and an ascending aneurysm.  First noted to have an ascending aneurysm in 2022.  Measured 4.6 cm at that time.  I last saw him in the office in August 2023.  There was question of some slight interval growth, but it was a noncontrast CT and there was motion artifact.  In the interim since his last visit he has gained some weight.  Not exercising on a regular basis.  Does check his blood pressure at home and it has been well-controlled for the most part.  Past Medical History:  Diagnosis Date   Ascending aortic aneurysm ([place]) 09/02/2020   4.6 cm by CT   DDD (degenerative disc disease), lumbosacral    Hepatic steatosis    Hyperlipidemia    Hypertension    Morbid obesity ([place])    Sleep apnea     Current Outpatient Medications  Medication Sig Dispense Refill   amLODipine (NORVASC) 5 MG tablet Take 1 tablet (5 mg total) by mouth daily. 90 tablet 3   cholecalciferol (VITAMIN D3) 25 MCG (1000 UNIT) tablet Take 1,000 Units by mouth daily.     Choline Dihydrogen Citrate (CHOLINE CITRATE PO) Take 3 tablets by mouth daily.     Cyanocobalamin (VITAMIN B 12 PO) Take 1 tablet by mouth daily.     Gamma-Aminobutyric Acid (GABA PO) Take by mouth.     Garlic 10 MG CAPS Take by mouth.     Ginger, Zingiber officinalis, (GINGER PO) Take by mouth.     Ginkgo Biloba 40 MG TABS Take by mouth.     losartan-hydrochlorothiazide (HYZAAR) 100-25 MG tablet TAKE 1 TABLET DAILY 90 tablet 3   magnesium 30 MG tablet Take 30 mg by mouth 2 (two) times daily.     Multiple Vitamin (MULTIVITAMIN WITH MINERALS) TABS tablet Take 1 tablet by mouth daily.     OVER THE COUNTER MEDICATION CBD Oil     TURMERIC PO Take by mouth.     No  current facility-administered medications for this visit.    Physical Exam BP 139/89 (BP Location: Left Arm, Patient Position: Sitting)   Pulse 76   Resp 18   Ht '6\' 1"'$  (1.854 m)   Wt (!) 354 lb (160.6 kg)   SpO2 91% Comment: RA  BMI 46.70 kg/m  Morbidly obese 67 year old man in no acute distress Alert and oriented x 3 with no focal deficits Lungs diminished but otherwise clear bilaterally Cardiac difficult to auscultate heart sounds but no audible murmur No peripheral edema  Diagnostic Tests: CT ANGIOGRAPHY CHEST WITH CONTRAST   TECHNIQUE: Multidetector CT imaging of the chest was performed using the standard protocol during bolus administration of intravenous contrast. Multiplanar CT image reconstructions and MIPs were obtained to evaluate the vascular anatomy.   RADIATION DOSE REDUCTION: This exam was performed according to the departmental dose-optimization program which includes automated exposure control, adjustment of the mA and/or kV according to patient size and/or use of iterative reconstruction technique.   Creatinine was obtained on site at [facility] at [address]   Results: Creatinine 1.0 mg/dL.   CONTRAST:  55m ISOVUE-370 IOPAMIDOL (ISOVUE-370) INJECTION 76%  COMPARISON:  CT chest dated September 26, 2021.   FINDINGS: Cardiovascular: It is difficult to measure the ascending thoracic aorta at the same location as the prior study due to motion artifact. The maximum diameter measured on today's study is 4.5 cm, previously 4.9 cm, although the prior measurements were likely accentuated by motion artifact as well. No dissection. Coronary, aortic arch, and branch vessel atherosclerotic vascular disease. Normal heart size. No pericardial effusion. No central pulmonary embolism.   Mediastinum/Nodes: No enlarged mediastinal, hilar, or axillary lymph nodes. Thyroid gland, trachea, and esophagus demonstrate no significant findings.    Lungs/Pleura: No focal consolidation, pleural effusion, or pneumothorax.   Upper Abdomen: No acute abnormality.   Musculoskeletal: No chest wall abnormality. No acute or significant osseous findings.   Review of the MIP images confirms the above findings.   IMPRESSION: 1. It is difficult to measure the ascending thoracic aorta at the same location as the prior study due to motion artifact. The maximum diameter measured on today's study is 4.5 cm, previously 4.9 cm, although the prior measurements were likely accentuated by motion artifact as well. 2.  Aortic Atherosclerosis (ICD10-I70.0).     Electronically Signed   By: Titus Dubin M.D.   On: 04/03/2022 11:08 I personally reviewed the CT images.  Motion artifact.  Ascending aneurysm around 4.5 to 4.6 cm.  Unchanged from prior just better measurements with contrast.  Impression: Aaron Duncan is a 67 year old man with history of morbid obesity, hypertension, hyperlipidemia, sleep apnea, degenerative disc disease, and an ascending aneurysm.  Ascending aneurysm-stable about 4.5 to 4.6 cm.  Needs continued semiannual follow-up.  Importance of blood pressure control emphasized.  Hypertension-blood pressure borderline today but he does check himself on a regular basis and has been reasonable at home.  He was under some stress coming to the appointment due to our parking situation.  Obesity-again emphasized importance of weight loss.  Unfortunately is actually gained weight since his last visit.  He had some misconceptions about activities.  I recommended he avoid lifting very heavy objects.  Other than that his activity is unrestricted and I would encourage regular aerobic exercise.  Plan: Watch diet and exercise Monitor blood pressure Return in 6 months with CT angio chest  Melrose Nakayama, MD [facility] [phone]

## 2022-05-02 ENCOUNTER — Telehealth: Payer: Self-pay | Admitting: Family Medicine

## 2022-05-02 NOTE — Telephone Encounter (Signed)
Called patient to schedule Medicare Annual Wellness Visit (AWV). Left message for patient to call back and schedule Medicare Annual Wellness Visit (AWV).  Last date of AWV: due 12/06/2020 awvi per [facility]   Please schedule an appointment at any time with Loma Sousa, [facility] .  If any questions, please contact me at [phone].  Thank you,  Colletta [place],  [place] Program Direct Dial ??[phone]

## 2022-05-07 ENCOUNTER — Other Ambulatory Visit: Payer: Self-pay | Admitting: Family Medicine

## 2022-05-14 ENCOUNTER — Telehealth: Payer: Self-pay | Admitting: Family Medicine

## 2022-05-14 NOTE — Telephone Encounter (Signed)
Called patient to schedule Medicare Annual Wellness Visit (AWV). Left message for patient to call back and schedule Medicare Annual Wellness Visit (AWV).  Last date of AWV: due 12/06/2020 awvi per [facility]   Please schedule an appointment at any time with Courtney, [facility] .  If any questions, please contact me at [phone].  Thank you,  Stephanie,  AMB Clinical Support [facility] AWV Program Direct Dial ??[phone]   

## 2022-05-14 NOTE — Telephone Encounter (Signed)
Contacted Aaron Duncan to schedule their annual wellness visit. Appointment made for 05/17/2022.  Thank you,  Judeth Cornfield,  AMB Clinical Support [facility] AWV Program Direct Dial ??[phone]

## 2022-05-17 ENCOUNTER — Ambulatory Visit: Payer: MEDICARE

## 2022-05-24 ENCOUNTER — Ambulatory Visit: Payer: MEDICARE

## 2022-05-31 ENCOUNTER — Ambulatory Visit: Payer: MEDICARE

## 2022-07-19 ENCOUNTER — Ambulatory Visit (INDEPENDENT_AMBULATORY_CARE_PROVIDER_SITE_OTHER): Payer: MEDICARE

## 2022-07-19 VITALS — Ht 73.0 in | Wt 354.0 lb

## 2022-07-19 DIAGNOSIS — Z0000 Encounter for general adult medical examination without abnormal findings: Secondary | ICD-10-CM

## 2022-07-19 DIAGNOSIS — I10 Essential (primary) hypertension: Secondary | ICD-10-CM

## 2022-07-19 MED ORDER — AMLODIPINE BESYLATE 5 MG PO TABS: 5.0000 mg | ORAL_TABLET | Freq: Every day | ORAL | 0 refills | Status: DC

## 2022-07-19 MED ORDER — LOSARTAN POTASSIUM-HCTZ 100-25 MG PO TABS: 1.0000 | ORAL_TABLET | Freq: Every day | ORAL | 0 refills | Status: DC

## 2022-07-19 NOTE — Progress Notes (Signed)
Subjective:   Aaron Duncan is a 67 y.o. male who presents for an Initial Medicare Annual Wellness Visit.  I connected with  Aaron Duncan on 07/19/22 by a audio enabled telemedicine application and verified that I am speaking with the correct person using two identifiers.  Patient Location: Home  Provider Location: Home Office  I discussed the limitations of evaluation and management by telemedicine. The patient expressed understanding and agreed to proceed.  Review of Systems     Cardiac Risk Factors include: advanced age (>30men, >67 women);diabetes mellitus;hypertension;male gender     Objective:    Today's Vitals   07/19/22 1632  Weight: (!) 354 lb (160.6 kg)  Height: 6\' 1"  (1.854 m)   Body mass index is 46.7 kg/m.     07/19/2022    4:42 PM 06/02/2020    3:20 PM  Advanced Directives  Does Patient Have a Medical Advance Directive? No No  Would patient like information on creating a medical advance directive? Yes (MAU/Ambulatory/Procedural Areas - Information given) Yes (MAU/Ambulatory/Procedural Areas - Information given)    Current Medications (verified) Outpatient Encounter Medications as of 07/19/2022  Medication Sig   cholecalciferol (VITAMIN D3) 25 MCG (1000 UNIT) tablet Take 1,000 Units by mouth daily.   Choline Dihydrogen Citrate (CHOLINE CITRATE PO) Take 3 tablets by mouth daily.   Cyanocobalamin (VITAMIN B 12 PO) Take 1 tablet by mouth daily.   Gamma-Aminobutyric Acid (GABA PO) Take by mouth.   Garlic 10 MG CAPS Take by mouth.   Ginger, Zingiber officinalis, (GINGER PO) Take by mouth.   Ginkgo Biloba 40 MG TABS Take by mouth.   magnesium 30 MG tablet Take 30 mg by mouth 2 (two) times daily.   Multiple Vitamin (MULTIVITAMIN WITH MINERALS) TABS tablet Take 1 tablet by mouth daily.   OVER THE COUNTER MEDICATION CBD Oil   TURMERIC PO Take by mouth.   [DISCONTINUED] amLODipine (NORVASC) 5 MG tablet Take 1 tablet (5 mg total) by mouth daily.   [DISCONTINUED]  losartan-hydrochlorothiazide (HYZAAR) 100-25 MG tablet TAKE 1 TABLET DAILY   amLODipine (NORVASC) 5 MG tablet Take 1 tablet (5 mg total) by mouth daily.   losartan-hydrochlorothiazide (HYZAAR) 100-25 MG tablet Take 1 tablet by mouth daily.   No facility-administered encounter medications on file as of 07/19/2022.    Allergies (verified) Patient has no known allergies.   History: Past Medical History:  Diagnosis Date   Ascending aortic aneurysm (HCC) 09/02/2020   4.6 cm by CT   DDD (degenerative disc disease), lumbosacral    Hepatic steatosis    Hyperlipidemia    Hypertension    Morbid obesity (HCC)    Sleep apnea    Past Surgical History:  Procedure Laterality Date   right elbow tendonitis     work related injury   SKIN CANCER EXCISION Right 2019   basal cell ca R side of face   Family History  Problem Relation Age of Onset   Diabetes Mother    AAA (abdominal aortic aneurysm) Father    Cancer Father        lung   Diabetes Maternal Uncle    AAA (abdominal aortic aneurysm) Paternal Grandfather    Colon polyps Brother    Colon cancer Neg Hx    Esophageal cancer Neg Hx    Rectal cancer Neg Hx    Stomach cancer Neg Hx    Social History   Socioeconomic History   Marital status: Married    Spouse name: Not on file  Number of children: Not on file   Years of education: Not on file   Highest education level: Not on file  Occupational History   Not on file  Tobacco Use   Smoking status: Never   Smokeless tobacco: Never  Vaping Use   Vaping Use: Never used  Substance and Sexual Activity   Alcohol use: Yes    Alcohol/week: 2.0 standard drinks of alcohol    Types: 2 Glasses of wine per week   Drug use: Never   Sexual activity: Not Currently  Other Topics Concern   Not on file  Social History Narrative   Not on file   Social Determinants of Health   Financial Resource Strain: Low Risk  (07/19/2022)   Overall Financial Resource Strain (CARDIA)    Difficulty of  Paying Living Expenses: Not hard at all  Food Insecurity: No Food Insecurity (07/19/2022)   Hunger Vital Sign    Worried About Running Out of Food in the Last Year: Never true    Ran Out of Food in the Last Year: Never true  Transportation Needs: No Transportation Needs (07/19/2022)   PRAPARE - Administrator, Civil Service (Medical): No    Lack of Transportation (Non-Medical): No  Physical Activity: Inactive (07/19/2022)   Exercise Vital Sign    Days of Exercise per Week: 0 days    Minutes of Exercise per Session: 0 min  Stress: No Stress Concern Present (07/19/2022)   [employer] of Occupational Health - Occupational Stress Questionnaire    Feeling of Stress : Not at all  Social Connections: Moderately Integrated (07/19/2022)   Social Connection and Isolation Panel [NHANES]    Frequency of Communication with Friends and Family: More than three times a week    Frequency of Social Gatherings with Friends and Family: Three times a week    Attends Religious Services: More than 4 times per year    Active Member of Clubs or Organizations: No    Attends Banker Meetings: Never    Marital Status: Married    Tobacco Counseling Counseling given: Not Answered   Clinical Intake:  Pre-visit preparation completed: Yes  Pain : No/denies pain     Diabetes: Yes CBG done?: No Did pt. bring in CBG monitor from home?: No  How often do you need to have someone help you when you read instructions, pamphlets, or other written materials from your doctor or pharmacy?: 1 - Never  Diabetic?yes   Nutrition Risk Assessment:  Has the patient had any N/V/D within the last 2 months?  No  Does the patient have any non-healing wounds?  No  Has the patient had any unintentional weight loss or weight gain?  No   Diabetes:  Is the patient diabetic?  Yes  If diabetic, was a CBG obtained today?  No  Did the patient bring in their glucometer from home?   No  How often do  you monitor your CBG's? Doesn't monitor at home.   Financial Strains and Diabetes Management:  Are you having any financial strains with the device, your supplies or your medication? No .  Does the patient want to be seen by Chronic Care Management for management of their diabetes?  No  Would the patient like to be referred to a Nutritionist or for Diabetic Management?  No   Diabetic Exams:  Diabetic Eye Exam: Overdue for diabetic eye exam. Pt has been advised about the importance in completing this exam. Patient advised to  call and schedule an eye exam. Diabetic Foot Exam: Overdue, Pt has been advised about the importance in completing this exam. Pt is scheduled for diabetic foot exam on at next office visit .   Interpreter Needed?: No  Information entered by :: Kandis Fantasia LPN   Activities of Daily Living    07/19/2022    4:42 PM 05/28/2022    8:30 AM  In your present state of health, do you have any difficulty performing the following activities:  Hearing? 0 0  Vision? 0 0  Difficulty concentrating or making decisions? 0 0  Walking or climbing stairs? 0 0  Dressing or bathing? 0 0  Doing errands, shopping? 0 0  Preparing Food and eating ? N N  Using the Toilet? N N  In the past six months, have you accidently leaked urine? N N  Do you have problems with loss of bowel control? N N  Managing your Medications? N N  Managing your Finances? N N  Housekeeping or managing your Housekeeping? N N    Patient Care Team: Donita Brooks, MD as PCP - General (Family Medicine) Loreli Slot, MD as Consulting Physician (Cardiothoracic Surgery)  Indicate any recent Medical Services you may have received from other than [facility] providers in the past year (date may be approximate).     Assessment:   This is a routine wellness examination for [place].  Hearing/Vision screen Hearing Screening - Comments:: Denies hearing difficulties   Vision Screening - Comments:: No vision  problems; will schedule routine eye exam soon    Dietary issues and exercise activities discussed: Current Exercise Habits: The patient does not participate in regular exercise at present   Goals Addressed             This Visit's Progress    Increase physical activity        Depression Screen    07/19/2022    4:41 PM 06/02/2020    3:17 PM 10/14/2017    3:06 PM  PHQ 2/9 Scores  PHQ - 2 Score 0 0 1    Fall Risk    07/19/2022    4:40 PM 05/28/2022    8:30 AM 05/21/2022   11:05 AM 06/02/2020    3:17 PM  Fall Risk   Falls in the past year? 0 0 0 0  Number falls in past yr: 0 0 0   Injury with Fall? 0 0 0   Risk for fall due to : No Fall Risks   No Fall Risks  Follow up Falls prevention discussed;Education provided;Falls evaluation completed   Falls evaluation completed    FALL RISK PREVENTION PERTAINING TO THE HOME:  Any stairs in or around the home? No  If so, are there any without handrails? No  Home free of loose throw rugs in walkways, pet beds, electrical cords, etc? Yes  Adequate lighting in your home to reduce risk of falls? Yes   ASSISTIVE DEVICES UTILIZED TO PREVENT FALLS:  Life alert? No  Use of a cane, walker or w/c? No  Grab bars in the bathroom? Yes  Shower chair or bench in shower? No  Elevated toilet seat or a handicapped toilet? Yes   TIMED UP AND GO:  Was the test performed? No . Telephonic visit   Cognitive Function:        07/19/2022    4:42 PM  6CIT Screen  What Year? 0 points  What month? 0 points  What time? 0 points  Count  back from 20 0 points  Months in reverse 0 points  Repeat phrase 0 points  Total Score 0 points    Immunizations Immunization History  Administered Date(s) Administered   Fluad Quad(high Dose 65+) 10/25/2020   Influenza-Unspecified 12/12/2021   Moderna Sars-Covid-2 Vaccination 09/17/2019, 10/15/2019   Pneumococcal Polysaccharide-23 10/25/2020    TDAP status: Due, Education has been provided regarding the  importance of this vaccine. Advised may receive this vaccine at local pharmacy or Health Dept. Aware to provide a copy of the vaccination record if obtained from local pharmacy or Health Dept. Verbalized acceptance and understanding.  Pneumococcal vaccine status: Up to date  Covid-19 vaccine status: Information provided on how to obtain vaccines.   Qualifies for Shingles Vaccine? Yes   Zostavax completed No   Shingrix Completed?: Yes  Screening Tests Health Maintenance  Topic Date Due   FOOT EXAM  Never done   OPHTHALMOLOGY EXAM  Never done   Diabetic kidney evaluation - Urine ACR  Never done   DTaP/Tdap/Td (1 - Tdap) Never done   Zoster Vaccines- Shingrix (1 of 2) Never done   COVID-19 Vaccine (3 - 2023-24 season) 10/06/2021   Pneumonia Vaccine 83+ Years old (2 of 2 - PCV) 10/25/2021   HEMOGLOBIN A1C  03/03/2022   Diabetic kidney evaluation - eGFR measurement  09/01/2022   INFLUENZA VACCINE  09/06/2022   Medicare Annual Wellness (AWV)  07/19/2023   Colonoscopy  07/08/2028   Hepatitis C Screening  Completed   HPV VACCINES  Aged Out    Health Maintenance  Health Maintenance Due  Topic Date Due   FOOT EXAM  Never done   OPHTHALMOLOGY EXAM  Never done   Diabetic kidney evaluation - Urine ACR  Never done   DTaP/Tdap/Td (1 - Tdap) Never done   Zoster Vaccines- Shingrix (1 of 2) Never done   COVID-19 Vaccine (3 - 2023-24 season) 10/06/2021   Pneumonia Vaccine 39+ Years old (2 of 2 - PCV) 10/25/2021   HEMOGLOBIN A1C  03/03/2022    Colorectal cancer screening: Type of screening: Colonoscopy. Completed 07/09/18. Repeat every 10 years  Lung Cancer Screening: (Low Dose CT Chest recommended if Age 57-80 years, 30 pack-year currently smoking OR have quit w/in 15years.) does not qualify.   Lung Cancer Screening Referral: n/a  Additional Screening:  Hepatitis C Screening: does qualify; Completed 10/14/17  Vision Screening: Recommended annual ophthalmology exams for early detection  of glaucoma and other disorders of the eye. Is the patient up to date with their annual eye exam?  No  Who is the provider or what is the name of the office in which the patient attends annual eye exams? none If pt is not established with a provider, would they like to be referred to a provider to establish care? No .   Dental Screening: Recommended annual dental exams for proper oral hygiene  Community Resource Referral / Chronic Care Management: CRR required this visit?  No   CCM required this visit?  No      Plan:     I have personally reviewed and noted the following in the patient's chart:   Medical and social history Use of alcohol, tobacco or illicit drugs  Current medications and supplements including opioid prescriptions. Patient is not currently taking opioid prescriptions. Functional ability and status Nutritional status Physical activity Advanced directives List of other physicians Hospitalizations, surgeries, and ER visits in previous 12 months Vitals Screenings to include cognitive, depression, and falls Referrals and appointments  In  addition, I have reviewed and discussed with patient certain preventive protocols, quality metrics, and best practice recommendations. A written personalized care plan for preventive services as well as general preventive health recommendations were provided to patient.     Kandis Fantasia [place], [place]   [id number]   Nurse Notes: No concerns; is asking for refills of blood pressure medications

## 2022-07-19 NOTE — Patient Instructions (Signed)
Aaron Duncan , Thank you for taking time to come for your Medicare Wellness Visit. I appreciate your ongoing commitment to your health goals. Please review the following plan we discussed and let me know if I can assist you in the future.   These are the goals we discussed:  Goals      Increase physical activity        This is a list of the screening recommended for you and due dates:  Health Maintenance  Topic Date Due   Complete foot exam   Never done   Eye exam for diabetics  Never done   Yearly kidney health urinalysis for diabetes  Never done   DTaP/Tdap/Td vaccine (1 - Tdap) Never done   Zoster (Shingles) Vaccine (1 of 2) Never done   COVID-19 Vaccine (3 - 2023-24 season) 10/06/2021   Pneumonia Vaccine (2 of 2 - PCV) 10/25/2021   Hemoglobin A1C  03/03/2022   Yearly kidney function blood test for diabetes  09/01/2022   Flu Shot  09/06/2022   Medicare Annual Wellness Visit  07/19/2023   Colon Cancer Screening  07/08/2028   Hepatitis C Screening  Completed   HPV Vaccine  Aged Out    Advanced directives: Information on Advanced Care Planning can be found at [facility] [place] Advance Health Care Directives Advance Health Care Directives ([email or web address] Please bring a copy of your health care power of attorney and living will to the office to be added to your chart at your convenience.  Conditions/risks identified: Aim for 30 minutes of exercise or brisk walking, 6-8 glasses of water, and 5 servings of fruits and vegetables each day.  Next appointment: Follow up in one year for your annual wellness visit.   Preventive Care 68 Years and Older, Male  Preventive care refers to lifestyle choices and visits with your health care provider that can promote health and wellness. What does preventive care include? A yearly physical exam. This is also called an annual well check. Dental exams once or twice a year. Routine eye exams. Ask your health care provider how often you  should have your eyes checked. Personal lifestyle choices, including: Daily care of your teeth and gums. Regular physical activity. Eating a healthy diet. Avoiding tobacco and drug use. Limiting alcohol use. Practicing safe sex. Taking low doses of aspirin every day. Taking vitamin and mineral supplements as recommended by your health care provider. What happens during an annual well check? The services and screenings done by your health care provider during your annual well check will depend on your age, overall health, lifestyle risk factors, and family history of disease. Counseling  Your health care provider may ask you questions about your: Alcohol use. Tobacco use. Drug use. Emotional well-being. Home and relationship well-being. Sexual activity. Eating habits. History of falls. Memory and ability to understand (cognition). Work and work Astronomer. Screening  You may have the following tests or measurements: Height, weight, and BMI. Blood pressure. Lipid and cholesterol levels. These may be checked every 5 years, or more frequently if you are over 11 years old. Skin check. Lung cancer screening. You may have this screening every year starting at age 37 if you have a 30-pack-year history of smoking and currently smoke or have quit within the past 15 years. Fecal occult blood test (FOBT) of the stool. You may have this test every year starting at age 102. Flexible sigmoidoscopy or colonoscopy. You may have a sigmoidoscopy every 5 years or a  colonoscopy every 10 years starting at age 51. Prostate cancer screening. Recommendations will vary depending on your family history and other risks. Hepatitis C blood test. Hepatitis B blood test. Sexually transmitted disease (STD) testing. Diabetes screening. This is done by checking your blood sugar (glucose) after you have not eaten for a while (fasting). You may have this done every 1-3 years. Abdominal aortic aneurysm (AAA)  screening. You may need this if you are a current or former smoker. Osteoporosis. You may be screened starting at age 36 if you are at high risk. Talk with your health care provider about your test results, treatment options, and if necessary, the need for more tests. Vaccines  Your health care provider may recommend certain vaccines, such as: Influenza vaccine. This is recommended every year. Tetanus, diphtheria, and acellular pertussis (Tdap, Td) vaccine. You may need a Td booster every 10 years. Zoster vaccine. You may need this after age 26. Pneumococcal 13-valent conjugate (PCV13) vaccine. One dose is recommended after age 53. Pneumococcal polysaccharide (PPSV23) vaccine. One dose is recommended after age 42. Talk to your health care provider about which screenings and vaccines you need and how often you need them. This information is not intended to replace advice given to you by your health care provider. Make sure you discuss any questions you have with your health care provider. Document Released: 02/18/2015 Document Revised: 10/12/2015 Document Reviewed: 11/23/2014 Elsevier Interactive Patient Education  2017 [employer].  Fall Prevention in the Home Falls can cause injuries. They can happen to people of all ages. There are many things you can do to make your home safe and to help prevent falls. What can I do on the outside of my home? Regularly fix the edges of walkways and driveways and fix any cracks. Remove anything that might make you trip as you walk through a door, such as a raised step or threshold. Trim any bushes or trees on the path to your home. Use bright outdoor lighting. Clear any walking paths of anything that might make someone trip, such as rocks or tools. Regularly check to see if handrails are loose or broken. Make sure that both sides of any steps have handrails. Any raised decks and porches should have guardrails on the edges. Have any leaves, snow, or ice  cleared regularly. Use sand or salt on walking paths during winter. Clean up any spills in your garage right away. This includes oil or grease spills. What can I do in the bathroom? Use night lights. Install grab bars by the toilet and in the tub and shower. Do not use towel bars as grab bars. Use non-skid mats or decals in the tub or shower. If you need to sit down in the shower, use a plastic, non-slip stool. Keep the floor dry. Clean up any water that spills on the floor as soon as it happens. Remove soap buildup in the tub or shower regularly. Attach bath mats securely with double-sided non-slip rug tape. Do not have throw rugs and other things on the floor that can make you trip. What can I do in the bedroom? Use night lights. Make sure that you have a light by your bed that is easy to reach. Do not use any sheets or blankets that are too big for your bed. They should not hang down onto the floor. Have a firm chair that has side arms. You can use this for support while you get dressed. Do not have throw rugs and other things  on the floor that can make you trip. What can I do in the kitchen? Clean up any spills right away. Avoid walking on wet floors. Keep items that you use a lot in easy-to-reach places. If you need to reach something above you, use a strong step stool that has a grab bar. Keep electrical cords out of the way. Do not use floor polish or wax that makes floors slippery. If you must use wax, use non-skid floor wax. Do not have throw rugs and other things on the floor that can make you trip. What can I do with my stairs? Do not leave any items on the stairs. Make sure that there are handrails on both sides of the stairs and use them. Fix handrails that are broken or loose. Make sure that handrails are as long as the stairways. Check any carpeting to make sure that it is firmly attached to the stairs. Fix any carpet that is loose or worn. Avoid having throw rugs at the  top or bottom of the stairs. If you do have throw rugs, attach them to the floor with carpet tape. Make sure that you have a light switch at the top of the stairs and the bottom of the stairs. If you do not have them, ask someone to add them for you. What else can I do to help prevent falls? Wear shoes that: Do not have high heels. Have rubber bottoms. Are comfortable and fit you well. Are closed at the toe. Do not wear sandals. If you use a stepladder: Make sure that it is fully opened. Do not climb a closed stepladder. Make sure that both sides of the stepladder are locked into place. Ask someone to hold it for you, if possible. Clearly mark and make sure that you can see: Any grab bars or handrails. First and last steps. Where the edge of each step is. Use tools that help you move around (mobility aids) if they are needed. These include: Canes. Walkers. Scooters. Crutches. Turn on the lights when you go into a dark area. Replace any light bulbs as soon as they burn out. Set up your furniture so you have a clear path. Avoid moving your furniture around. If any of your floors are uneven, fix them. If there are any pets around you, be aware of where they are. Review your medicines with your doctor. Some medicines can make you feel dizzy. This can increase your chance of falling. Ask your doctor what other things that you can do to help prevent falls. This information is not intended to replace advice given to you by your health care provider. Make sure you discuss any questions you have with your health care provider. Document Released: 11/18/2008 Document Revised: 06/30/2015 Document Reviewed: 02/26/2014 Elsevier Interactive Patient Education  2017 [employer].

## 2022-08-14 ENCOUNTER — Other Ambulatory Visit: Payer: Self-pay | Admitting: Thoracic Surgery (Cardiothoracic Vascular Surgery)

## 2022-08-14 DIAGNOSIS — I7121 Aneurysm of the ascending aorta, without rupture: Secondary | ICD-10-CM

## 2022-10-02 ENCOUNTER — Ambulatory Visit
Admission: RE | Admit: 2022-10-02 | Discharge: 2022-10-02 | Disposition: A | Discharge status: Home / Self Care | Payer: MEDICARE | Source: Ambulatory Visit | Attending: Thoracic Surgery (Cardiothoracic Vascular Surgery) | Admitting: Thoracic Surgery (Cardiothoracic Vascular Surgery)

## 2022-10-02 ENCOUNTER — Other Ambulatory Visit: Payer: Self-pay | Admitting: Thoracic Surgery (Cardiothoracic Vascular Surgery)

## 2022-10-02 ENCOUNTER — Encounter: Payer: Self-pay | Admitting: Thoracic Surgery (Cardiothoracic Vascular Surgery)

## 2022-10-02 ENCOUNTER — Ambulatory Visit (INDEPENDENT_AMBULATORY_CARE_PROVIDER_SITE_OTHER): Payer: MEDICARE | Admitting: Thoracic Surgery (Cardiothoracic Vascular Surgery)

## 2022-10-02 VITALS — BP 145/78 | HR 94 | Resp 18 | Ht 73.0 in | Wt 347.0 lb

## 2022-10-02 DIAGNOSIS — I7121 Aneurysm of the ascending aorta, without rupture: Secondary | ICD-10-CM | POA: Diagnosis not present

## 2022-10-02 NOTE — Progress Notes (Signed)
[address]       [name] [address]             [phone]     HPI: Mr. Chalifour returns for follow-up of his ascending aneurysm.  Paulie Astin is a 67 year old man with a history of morbid obesity, hypertension, hyperlipidemia, sleep apnea, type 2 diabetes, degenerative disc disease, and an ascending aortic aneurysm.  First found to have an ascending aneurysm in 2022 measured 4.6 cm.  I last saw him in the office in February of this year.  He was feeling well at that time.  In the interim since his last visit he has not had any issues.  He has not lost any weight.  He has not really been trying to lose weight.  He says he is monitoring his blood pressure at home and has been well-controlled.  He refused contrast because he ate a big breakfast including a lot of sausage.  Past Medical History:  Diagnosis Date   Ascending aortic aneurysm (HCC) 09/02/2020   4.6 cm by CT   DDD (degenerative disc disease), lumbosacral    Hepatic steatosis    Hyperlipidemia    Hypertension    Morbid obesity (HCC)    Sleep apnea     Current Outpatient Medications  Medication Sig Dispense Refill   amLODipine (NORVASC) 5 MG tablet Take 1 tablet (5 mg total) by mouth daily. 90 tablet 0   cholecalciferol (VITAMIN D3) 25 MCG (1000 UNIT) tablet Take 1,000 Units by mouth daily.     Choline Dihydrogen Citrate (CHOLINE CITRATE PO) Take 3 tablets by mouth daily.     Cyanocobalamin (VITAMIN B 12 PO) Take 1 tablet by mouth daily.     Gamma-Aminobutyric Acid (GABA PO) Take by mouth.     Garlic 10 MG CAPS Take by mouth.     Ginger, Zingiber officinalis, (GINGER PO) Take by mouth.     Ginkgo Biloba 40 MG TABS Take by mouth.     losartan-hydrochlorothiazide (HYZAAR) 100-25 MG tablet Take 1 tablet by mouth daily. 90 tablet 0   magnesium 30 MG tablet Take 30 mg by mouth 2 (two) times daily.     Multiple Vitamin (MULTIVITAMIN WITH MINERALS) TABS tablet Take 1 tablet by mouth daily.     OVER THE  COUNTER MEDICATION CBD Oil     TURMERIC PO Take by mouth.     No current facility-administered medications for this visit.    Physical Exam BP (!) 145/78 (BP Location: Left Arm, Patient Position: Sitting)   Pulse 94   Resp 18   Ht 6\' 1"  (1.854 m)   Wt (!) 347 lb (157.4 kg)   SpO2 91% Comment: RA  BMI 45.58 kg/m  67 year old man in no acute distress Morbidly obese Alert and oriented x 3 with no focal deficits Cardiac regular rate and rhythm no audible murmur Lungs diminished breath sounds bilateral but no rales or wheezing  Diagnostic Tests: I personally reviewed the CT images.  I see no change in the 4.5 cm ascending aneurysm.  Await official radiology interpretation.  Impression: Nikkolas Ludemann is a 67 year old man with a history of morbid obesity, hypertension, hyperlipidemia, sleep apnea, type 2 diabetes, degenerative disc disease, and an ascending aortic aneurysm.  Ascending aneurysm-refused contrast because "I ate a big breakfast this morning."  Side-by-side comparison with previous film reveals no significant change in his ascending aneurysm.  Hypertension-blood pressure elevated.  He attributes that to eating a lot of  sausage this morning with a high salt content.  Advised him that he needs to avoid salt and watch his diet.  Continue to monitor blood pressure.  If persistently above 140 need to medication adjustment or addition.  Obesity-strongly emphasized the importance of weight loss for his overall health.  Needs significant lifestyle changes.  Encouraged him to do so.  Plan: Return in 6 months with CT angio of chest  Loreli Slot, MD [facility] [phone]

## 2022-10-04 ENCOUNTER — Other Ambulatory Visit: Payer: Self-pay | Admitting: Family Medicine

## 2022-10-05 NOTE — Telephone Encounter (Signed)
Requested medication (s) are due for refill today: yes  Requested medication (s) are on the active medication list: yes  Last refill:  07/19/22  Future visit scheduled: no  Notes to clinic:  Unable to refill per protocol, courtesy refill already given, routing for provider approval.      Requested Prescriptions  Pending Prescriptions Disp Refills   losartan-hydrochlorothiazide (HYZAAR) 100-25 MG tablet [Pharmacy Med Name: LOSARTAN/ HYDROCHLOROTHIAZIDE TABS 100/25MG ] 90 tablet 3    Sig: TAKE 1 TABLET DAILY (MUST HAVE OFFICE VISIT FOR FURTHER REFILLS)     Cardiovascular: ARB + Diuretic Combos Failed - 10/04/2022 10:05 AM      Failed - K in normal range and within 180 days    Potassium  Date Value Ref Range Status  08/31/2021 3.9 3.5 - 5.3 mmol/L Final         Failed - Na in normal range and within 180 days    Sodium  Date Value Ref Range Status  08/31/2021 138 135 - 146 mmol/L Final         Failed - Cr in normal range and within 180 days    Creat  Date Value Ref Range Status  08/31/2021 0.83 0.70 - 1.35 mg/dL Final         Failed - eGFR is 10 or above and within 180 days    GFR, Est African American  Date Value Ref Range Status  06/02/2020 106 > OR = 60 mL/min/1.110m2 Final   GFR, Est Non African American  Date Value Ref Range Status  06/02/2020 92 > OR = 60 mL/min/1.68m2 Final   eGFR  Date Value Ref Range Status  08/31/2021 97 > OR = 60 mL/min/1.39m2 Final    Comment:    The eGFR is based on the CKD-EPI 2021 equation. To calculate  the new eGFR from a previous Creatinine or Cystatin C result, go to https://www.kidney.org/professionals/ kdoqi/[email or web address]          Failed - Last BP in normal range    BP Readings from Last 1 Encounters:  10/02/22 (!) 145/78         Failed - Valid encounter within last 6 months    Recent Outpatient Visits           1 year ago Type 2 diabetes mellitus without complication, without long-term current use of insulin (HCC)    [facility] Donita Brooks, MD   2 years ago New onset type 2 diabetes mellitus (HCC)   [facility] Donita Brooks, MD   2 years ago New onset type 2 diabetes mellitus (HCC)   [facility] Donita Brooks, MD   2 years ago Morbid obesity [facility])   [facility] Pickard, Priscille Heidelberg, MD   4 years ago Encounter to establish care with new doctor   [facility] Pickard, Priscille Heidelberg, MD              Passed - Patient is not pregnant

## 2022-10-16 ENCOUNTER — Ambulatory Visit (INDEPENDENT_AMBULATORY_CARE_PROVIDER_SITE_OTHER): Payer: MEDICARE | Admitting: Family Medicine

## 2022-10-16 VITALS — BP 132/74 | HR 89 | Temp 98.4°F | Ht 73.0 in | Wt 344.4 lb

## 2022-10-16 DIAGNOSIS — E1169 Type 2 diabetes mellitus with other specified complication: Secondary | ICD-10-CM | POA: Diagnosis not present

## 2022-10-16 DIAGNOSIS — R2242 Localized swelling, mass and lump, left lower limb: Secondary | ICD-10-CM

## 2022-10-16 NOTE — Progress Notes (Signed)
Subjective:    Patient ID: Aaron Duncan, male    DOB: [birth year], 67 y.o.   MRN: [id number]  HPI  Patient has not been seen in over a year.  Patient has type 2 diabetes mellitus.  He has refused treatment options in the past.  He is long overdue for repeat with lab work.  However he presents today with a mass on his lower left leg.  Inferior to his knee and medial to his knee over the anterior shin is a soft tissue mass.  It is deep to the skin and likely in the subcutaneous fat.  It is 5 cm x 3 cm.  It is firm.  It is not mobile.  It is nontender.  There is no erythema or warmth or fluctuance.  He denies any hematoma in that area.  He denies any in the area however the lesion has been there for the last 3 months.  He states it may have been bigger initially Past Medical History:  Diagnosis Date   Ascending aortic aneurysm (HCC) 09/02/2020   4.6 cm by CT   DDD (degenerative disc disease), lumbosacral    Hepatic steatosis    Hyperlipidemia    Hypertension    Morbid obesity (HCC)    Sleep apnea    Past Surgical History:  Procedure Laterality Date   right elbow tendonitis     work related injury   SKIN CANCER EXCISION Right 2019   basal cell ca R side of face    Current Outpatient Medications on File Prior to Visit  Medication Sig Dispense Refill   amLODipine (NORVASC) 5 MG tablet Take 1 tablet (5 mg total) by mouth daily. 90 tablet 0   cholecalciferol (VITAMIN D3) 25 MCG (1000 UNIT) tablet Take 1,000 Units by mouth daily.     Choline Dihydrogen Citrate (CHOLINE CITRATE PO) Take 3 tablets by mouth daily.     Cyanocobalamin (VITAMIN B 12 PO) Take 1 tablet by mouth daily.     Gamma-Aminobutyric Acid (GABA PO) Take by mouth.     Garlic 10 MG CAPS Take by mouth.     Ginger, Zingiber officinalis, (GINGER PO) Take by mouth.     Ginkgo Biloba 40 MG TABS Take by mouth.     losartan-hydrochlorothiazide (HYZAAR) 100-25 MG tablet Take 1 tablet by mouth daily. 90 tablet 0   magnesium 30 MG tablet  Take 30 mg by mouth 2 (two) times daily.     Multiple Vitamin (MULTIVITAMIN WITH MINERALS) TABS tablet Take 1 tablet by mouth daily.     OVER THE COUNTER MEDICATION CBD Oil     TURMERIC PO Take by mouth.     No current facility-administered medications on file prior to visit.    No Known Allergies Social History   Socioeconomic History   Marital status: Married    Spouse name: Not on file   Number of children: Not on file   Years of education: Not on file   Highest education level: 12th grade  Occupational History   Not on file  Tobacco Use   Smoking status: Never   Smokeless tobacco: Never  Vaping Use   Vaping status: Never Used  Substance and Sexual Activity   Alcohol use: Yes    Alcohol/week: 2.0 standard drinks of alcohol    Types: 2 Glasses of wine per week   Drug use: Never   Sexual activity: Not Currently  Other Topics Concern   Not on file  Social History Narrative  Not on file   Social Determinants of Health   Financial Resource Strain: Low Risk  (10/13/2022)   Overall Financial Resource Strain (CARDIA)    Difficulty of Paying Living Expenses: Not hard at all  Food Insecurity: No Food Insecurity (10/13/2022)   Hunger Vital Sign    Worried About Running Out of Food in the Last Year: Never true    Ran Out of Food in the Last Year: Never true  Transportation Needs: No Transportation Needs (10/13/2022)   PRAPARE - Administrator, Civil Service (Medical): No    Lack of Transportation (Non-Medical): No  Physical Activity: Unknown (10/13/2022)   Exercise Vital Sign    Days of Exercise per Week: Patient declined    Minutes of Exercise per Session: 0 min  Recent Concern: Physical Activity - Inactive (07/19/2022)   Exercise Vital Sign    Days of Exercise per Week: 0 days    Minutes of Exercise per Session: 0 min  Stress: Patient Declined (10/13/2022)   [employer] of Occupational Health - Occupational Stress Questionnaire    Feeling of Stress : Patient  declined  Social Connections: Unknown (10/13/2022)   Social Connection and Isolation Panel [NHANES]    Frequency of Communication with Friends and Family: More than three times a week    Frequency of Social Gatherings with Friends and Family: More than three times a week    Attends Religious Services: Patient declined    Database administrator or Organizations: Patient declined    Attends Banker Meetings: Never    Marital Status: Married  Catering manager Violence: Not At Risk (07/19/2022)   Humiliation, Afraid, Rape, and Kick questionnaire    Fear of Current or Ex-Partner: No    Emotionally Abused: No    Physically Abused: No    Sexually Abused: No  Patient is a retired Financial controller Family History  Problem Relation Age of Onset   Diabetes Mother    AAA (abdominal aortic aneurysm) Father    Cancer Father        lung   Diabetes Maternal Uncle    AAA (abdominal aortic aneurysm) Paternal Grandfather    Colon polyps Brother    Colon cancer Neg Hx    Esophageal cancer Neg Hx    Rectal cancer Neg Hx    Stomach cancer Neg Hx       Review of Systems     Objective:   Physical Exam Constitutional:      Appearance: He is obese.  Cardiovascular:     Rate and Rhythm: Normal rate and regular rhythm.     Heart sounds: Normal heart sounds.  Pulmonary:     Effort: Pulmonary effort is normal.     Breath sounds: Normal breath sounds.  Musculoskeletal:     Lumbar back: Tenderness and bony tenderness present. Decreased range of motion.     Right lower leg: No tenderness or bony tenderness.     Left lower leg: Deformity present. No tenderness or bony tenderness.       Legs:  Skin:    Findings: No erythema or rash.  Neurological:     Mental Status: He is alert.         Assessment & Plan:  Type 2 diabetes mellitus with other specified complication, without long-term current use of insulin (HCC) - Plan: CBC with Differential/Platelet, COMPLETE METABOLIC PANEL WITH  GFR, Hemoglobin A1c  Mass of left lower leg - Plan: CT EXTREMITY LOWER LEFT W  CONTRAST Differential diagnosis includes lipoma versus hematoma that is calcified versus deep to fascia cyst versus malignancy.  I favor lipoma versus hematoma.  However, it is impossible to tell based on exam as the lesion is extremely deep to the touch firm, nonmobile.  Patient would like to proceed with a CT scan of the leg to evaluate further rather than surgery to remove the lesion.  While checking lab work I will repeat lab work to monitor his diabetes

## 2022-10-17 ENCOUNTER — Other Ambulatory Visit: Payer: Self-pay | Admitting: Family Medicine

## 2022-10-17 DIAGNOSIS — I10 Essential (primary) hypertension: Secondary | ICD-10-CM

## 2022-10-17 LAB — COMPLETE METABOLIC PANEL WITH GFR
AG Ratio: 1.5 (calc) (ref 1.0–2.5)
ALT: 29 U/L (ref 9–46)
AST: 26 U/L (ref 10–35)
Albumin: 4.5 g/dL (ref 3.6–5.1)
Alkaline phosphatase (APISO): 85 U/L (ref 35–144)
BUN: 20 mg/dL (ref 7–25)
CO2: 24 mmol/L (ref 20–32)
Calcium: 9.8 mg/dL (ref 8.6–10.3)
Chloride: 103 mmol/L (ref 98–110)
Creat: 0.84 mg/dL (ref 0.70–1.35)
Globulin: 3 g/dL (ref 1.9–3.7)
Glucose, Bld: 114 mg/dL — ABNORMAL HIGH (ref 65–99)
Potassium: 4.1 mmol/L (ref 3.5–5.3)
Sodium: 138 mmol/L (ref 135–146)
Total Bilirubin: 0.7 mg/dL (ref 0.2–1.2)
Total Protein: 7.5 g/dL (ref 6.1–8.1)
eGFR: 96 mL/min/{1.73_m2} (ref >=60)

## 2022-10-17 LAB — CBC WITH DIFFERENTIAL/PLATELET
Absolute Monocytes: 883 {cells}/uL (ref 200–950)
Basophils Absolute: 57 {cells}/uL (ref 0–200)
Basophils Relative: 0.7 %
Eosinophils Absolute: 219 {cells}/uL (ref 15–500)
Eosinophils Relative: 2.7 %
HCT: 50.3 % — ABNORMAL HIGH (ref 38.5–50.0)
Hemoglobin: 16.9 g/dL (ref 13.2–17.1)
Lymphs Abs: 2738 {cells}/uL (ref 850–3900)
MCH: 30.8 pg (ref 27.0–33.0)
MCHC: 33.6 g/dL (ref 32.0–36.0)
MCV: 91.6 fL (ref 80.0–100.0)
MPV: 11.2 fL (ref 7.5–12.5)
Monocytes Relative: 10.9 %
Neutro Abs: 4204 {cells}/uL (ref 1500–7800)
Neutrophils Relative %: 51.9 %
Platelets: 246 10*3/uL (ref 140–400)
RBC: 5.49 10*6/uL (ref 4.20–5.80)
RDW: 13.1 % (ref 11.0–15.0)
Total Lymphocyte: 33.8 %
WBC: 8.1 10*3/uL (ref 3.8–10.8)

## 2022-10-17 LAB — HEMOGLOBIN A1C
Hgb A1c MFr Bld: 7.1 %{Hb} — ABNORMAL HIGH (ref <5.7)
Mean Plasma Glucose: 157 mg/dL
eAG (mmol/L): 8.7 mmol/L

## 2022-10-18 ENCOUNTER — Other Ambulatory Visit: Payer: Self-pay | Admitting: Family Medicine

## 2022-10-18 DIAGNOSIS — I10 Essential (primary) hypertension: Secondary | ICD-10-CM

## 2022-10-18 NOTE — Telephone Encounter (Signed)
Requested Prescriptions  Pending Prescriptions Disp Refills   losartan-hydrochlorothiazide (HYZAAR) 100-25 MG tablet [Pharmacy Med Name: LOSARTAN/ HYDROCHLOROTHIAZIDE TABS 100/25MG ] 90 tablet 1    Sig: TAKE 1 TABLET DAILY (MUST HAVE OFFICE VISIT FOR FURTHER REFILLS)     Cardiovascular: ARB + Diuretic Combos Failed - 10/18/2022  9:49 AM      Failed - Valid encounter within last 6 months    Recent Outpatient Visits           1 year ago Type 2 diabetes mellitus without complication, without long-term current use of insulin (HCC)   [facility] Pickard, Priscille Heidelberg, MD   2 years ago New onset type 2 diabetes mellitus (HCC)   [facility] Donita Brooks, MD   2 years ago New onset type 2 diabetes mellitus (HCC)   [facility] Donita Brooks, MD   2 years ago Morbid obesity (HCC)   [facility] Donita Brooks, MD   5 years ago Encounter to establish care with new doctor   [facility] Pickard, Priscille Heidelberg, MD              Passed - K in normal range and within 180 days    Potassium  Date Value Ref Range Status  10/16/2022 4.1 3.5 - 5.3 mmol/L Final         Passed - Na in normal range and within 180 days    Sodium  Date Value Ref Range Status  10/16/2022 138 135 - 146 mmol/L Final         Passed - Cr in normal range and within 180 days    Creat  Date Value Ref Range Status  10/16/2022 0.84 0.70 - 1.35 mg/dL Final         Passed - eGFR is 10 or above and within 180 days    GFR, Est African American  Date Value Ref Range Status  06/02/2020 106 > OR = 60 mL/min/1.71m2 Final   GFR, Est Non African American  Date Value Ref Range Status  06/02/2020 92 > OR = 60 mL/min/1.53m2 Final   eGFR  Date Value Ref Range Status  10/16/2022 96 > OR = 60 mL/min/1.69m2 Final         Passed - Patient is not pregnant      Passed - Last BP in normal range    BP Readings from Last 1 Encounters:  10/16/22  132/74         Refused Prescriptions Disp Refills   amLODipine (NORVASC) 5 MG tablet [Pharmacy Med Name: AMLODIPINE BESYLATE TABS 5MG ] 90 tablet 3    Sig: TAKE 1 TABLET DAILY     Cardiovascular: Calcium Channel Blockers 2 Failed - 10/18/2022  9:49 AM      Failed - Valid encounter within last 6 months    Recent Outpatient Visits           1 year ago Type 2 diabetes mellitus without complication, without long-term current use of insulin (HCC)   [facility] Donita Brooks, MD   2 years ago New onset type 2 diabetes mellitus (HCC)   [facility] Donita Brooks, MD   2 years ago New onset type 2 diabetes mellitus (HCC)   [facility] Pickard, Priscille Heidelberg, MD   2 years ago Morbid obesity [facility])   [facility] Pickard, Priscille Heidelberg, MD   5 years ago  Encounter to establish care with new doctor   [facility] Pickard, Priscille Heidelberg, MD              Passed - Last BP in normal range    BP Readings from Last 1 Encounters:  10/16/22 132/74         Passed - Last Heart Rate in normal range    Pulse Readings from Last 1 Encounters:  10/16/22 89

## 2022-10-18 NOTE — Telephone Encounter (Signed)
Last OV 10/16/22 within protocol.  Requested Prescriptions  Pending Prescriptions Disp Refills   amLODipine (NORVASC) 5 MG tablet [Pharmacy Med Name: AMLODIPINE BESYLATE TABS 5MG ] 90 tablet 0    Sig: TAKE 1 TABLET DAILY (APPOINTMENT NEEDED FOR FURTHER REFILLS)     Cardiovascular: Calcium Channel Blockers 2 Failed - 10/17/2022 12:53 AM      Failed - Valid encounter within last 6 months    Recent Outpatient Visits           1 year ago Type 2 diabetes mellitus without complication, without long-term current use of insulin (HCC)   [facility] Pickard, Priscille Heidelberg, MD   2 years ago New onset type 2 diabetes mellitus (HCC)   [facility] Donita Brooks, MD   2 years ago New onset type 2 diabetes mellitus (HCC)   [facility] Pickard, Priscille Heidelberg, MD   2 years ago Morbid obesity [facility])   [facility] Pickard, Priscille Heidelberg, MD   5 years ago Encounter to establish care with new doctor   [facility] Pickard, Priscille Heidelberg, MD              Passed - Last BP in normal range    BP Readings from Last 1 Encounters:  10/16/22 132/74         Passed - Last Heart Rate in normal range    Pulse Readings from Last 1 Encounters:  10/16/22 89

## 2022-10-23 ENCOUNTER — Other Ambulatory Visit: Payer: Self-pay | Admitting: Family Medicine

## 2022-10-23 DIAGNOSIS — R2242 Localized swelling, mass and lump, left lower limb: Secondary | ICD-10-CM

## 2022-10-24 ENCOUNTER — Encounter: Payer: Self-pay | Admitting: Family Medicine

## 2022-10-25 ENCOUNTER — Other Ambulatory Visit: Payer: Self-pay

## 2022-10-25 DIAGNOSIS — E1169 Type 2 diabetes mellitus with other specified complication: Secondary | ICD-10-CM

## 2022-10-25 DIAGNOSIS — I10 Essential (primary) hypertension: Secondary | ICD-10-CM

## 2022-10-25 MED ORDER — AMLODIPINE BESYLATE 5 MG PO TABS: 5.0000 mg | ORAL_TABLET | Freq: Every day | ORAL | 3 refills | Status: DC

## 2022-10-25 MED ORDER — LOSARTAN POTASSIUM-HCTZ 100-25 MG PO TABS
1.0000 | ORAL_TABLET | Freq: Every day | ORAL | 3 refills | Status: DC
Start: 2022-10-25 — End: 2023-11-20

## 2022-11-15 ENCOUNTER — Ambulatory Visit
Admission: RE | Admit: 2022-11-15 | Discharge: 2022-11-15 | Disposition: A | Discharge status: Home / Self Care | Payer: MEDICARE | Source: Ambulatory Visit | Attending: Family Medicine | Admitting: Family Medicine

## 2022-11-15 DIAGNOSIS — R2242 Localized swelling, mass and lump, left lower limb: Secondary | ICD-10-CM

## 2022-11-15 MED ORDER — IOPAMIDOL (ISOVUE-300) INJECTION 61%
100.0000 mL | Freq: Once | INTRAVENOUS | Status: AC | PRN
  Administered 2022-11-15: 75 mL via INTRAVENOUS

## 2022-12-03 ENCOUNTER — Encounter: Payer: Self-pay | Admitting: Family Medicine

## 2022-12-03 ENCOUNTER — Other Ambulatory Visit: Payer: Self-pay

## 2022-12-03 DIAGNOSIS — I10 Essential (primary) hypertension: Secondary | ICD-10-CM

## 2022-12-04 ENCOUNTER — Other Ambulatory Visit: Payer: Self-pay

## 2022-12-04 DIAGNOSIS — I10 Essential (primary) hypertension: Secondary | ICD-10-CM

## 2022-12-04 MED ORDER — AMLODIPINE BESYLATE 5 MG PO TABS: 5.0000 mg | ORAL_TABLET | Freq: Every day | ORAL | 1 refills | Status: DC

## 2023-01-25 ENCOUNTER — Ambulatory Visit (INDEPENDENT_AMBULATORY_CARE_PROVIDER_SITE_OTHER): Payer: MEDICARE

## 2023-01-25 DIAGNOSIS — E119 Type 2 diabetes mellitus without complications: Secondary | ICD-10-CM | POA: Diagnosis not present

## 2023-01-25 DIAGNOSIS — Z7984 Long term (current) use of oral hypoglycemic drugs: Secondary | ICD-10-CM

## 2023-01-25 LAB — HM DIABETES EYE EXAM

## 2023-01-25 NOTE — Progress Notes (Signed)
Aaron Duncan arrived 01/25/2023 and has given verbal consent to obtain images and complete their overdue diabetic retinal screening.  The images have been sent to an ophthalmologist or optometrist for review and interpretation.  Results will be sent back to Donita Brooks, MD for review.  Patient has been informed they will be contacted when we receive the results via telephone or MyChart

## 2023-02-14 ENCOUNTER — Other Ambulatory Visit: Payer: Self-pay | Admitting: Thoracic Surgery (Cardiothoracic Vascular Surgery)

## 2023-02-14 DIAGNOSIS — I7121 Aneurysm of the ascending aorta, without rupture: Secondary | ICD-10-CM

## 2023-04-09 ENCOUNTER — Ambulatory Visit (INDEPENDENT_AMBULATORY_CARE_PROVIDER_SITE_OTHER): Payer: MEDICARE | Admitting: Thoracic Surgery (Cardiothoracic Vascular Surgery)

## 2023-04-09 ENCOUNTER — Ambulatory Visit
Admission: RE | Admit: 2023-04-09 | Discharge: 2023-04-09 | Disposition: A | Discharge status: Home / Self Care | Payer: MEDICARE | Source: Ambulatory Visit | Attending: Thoracic Surgery (Cardiothoracic Vascular Surgery) | Admitting: Thoracic Surgery (Cardiothoracic Vascular Surgery)

## 2023-04-09 VITALS — BP 165/96 | HR 80 | Resp 18 | Ht 73.0 in | Wt 348.0 lb

## 2023-04-09 DIAGNOSIS — I10 Essential (primary) hypertension: Secondary | ICD-10-CM

## 2023-04-09 DIAGNOSIS — I7121 Aneurysm of the ascending aorta, without rupture: Secondary | ICD-10-CM | POA: Diagnosis not present

## 2023-04-09 MED ORDER — AMLODIPINE BESYLATE 10 MG PO TABS: 10.0000 mg | ORAL_TABLET | Freq: Every day | ORAL | 6 refills | Status: DC

## 2023-04-09 MED ORDER — IOPAMIDOL (ISOVUE-370) INJECTION 76%
75.0000 mL | Freq: Once | INTRAVENOUS | Status: AC | PRN
  Administered 2023-04-09: 75 mL via INTRAVENOUS

## 2023-04-09 MED ORDER — AMLODIPINE BESYLATE 10 MG PO TABS
10.0000 mg | ORAL_TABLET | Freq: Every day | ORAL | 6 refills | Status: DC
Start: 2023-04-09 — End: 2023-04-09

## 2023-04-09 NOTE — Progress Notes (Signed)
 [address]       [name] [address]             [phone]     HPI: [name] returns for scheduled follow-up visit regarding his ascending aortic aneurysm  Aaron Duncan is a 68 year old man with a history of morbid obesity, hypertension, hyperlipidemia, sleep apnea, type 2 diabetes, degenerative disc disease, and ascending aortic aneurysm.  Followed for an ascending aneurysm since 2022.  Measured 4.6 cm at that time.  I last saw him in August.  Was feeling well at that time, but was not monitoring his blood pressure.  In the interim since his last visit he has had trouble with back pain for the past couple of months.  He says he has been on his couch mostly.  Has not had any weight loss.  Has been checking his blood pressure and usually is around 120 systolic.  Past Medical History:  Diagnosis Date   Ascending aortic aneurysm (HCC) 09/02/2020   4.6 cm by CT   DDD (degenerative disc disease), lumbosacral    Hepatic steatosis    Hyperlipidemia    Hypertension    Morbid obesity (HCC)    Sleep apnea     Current Outpatient Medications  Medication Sig Dispense Refill   cholecalciferol (VITAMIN D3) 25 MCG (1000 UNIT) tablet Take 1,000 Units by mouth daily.     Choline Dihydrogen Citrate (CHOLINE CITRATE PO) Take 3 tablets by mouth daily.     Cyanocobalamin (VITAMIN B 12 PO) Take 1 tablet by mouth daily.     Gamma-Aminobutyric Acid (GABA PO) Take by mouth.     Garlic 10 MG CAPS Take by mouth.     Ginger, Zingiber officinalis, (GINGER PO) Take by mouth.     Ginkgo Biloba 40 MG TABS Take by mouth.     losartan-hydrochlorothiazide (HYZAAR) 100-25 MG tablet Take 1 tablet by mouth daily. 90 tablet 3   magnesium 30 MG tablet Take 30 mg by mouth 2 (two) times daily.     Multiple Vitamin (MULTIVITAMIN WITH MINERALS) TABS tablet Take 1 tablet by mouth daily.     TURMERIC PO Take by mouth.     amLODipine (NORVASC) 10 MG tablet Take 1 tablet (10 mg total) by mouth daily.  30 tablet 6   No current facility-administered medications for this visit.    Physical Exam BP (!) 165/96 (BP Location: Right Arm)   Pulse 80   Resp 18   Ht 6\' 1"  (1.854 m)   Wt (!) 348 lb (157.9 kg)   SpO2 92%   BMI 45.68 kg/m  68 year old man in no acute distress Obese Alert and oriented x 3 with no focal deficits Lungs clear bilaterally Cardiac regular rate and rhythm with normal S1 and S2  Diagnostic Tests: I personally reviewed the chest CT the images.  Official reading not yet done. Ascending aortic aneurysm approximately 4.6 cm in diameter.  Very elongated with thick extreme curvature making axial measurements difficult.  Measurement based on coronal and sagittal reconstructions.  Impression: Aaron Duncan is a 68 year old man with a history of morbid obesity, hypertension, hyperlipidemia, sleep apnea, type 2 diabetes, degenerative disc disease, and ascending aortic aneurysm.  Followed for an ascending aneurysm since 2022.  Measured 4.6 cm at that time.  Ascending aortic aneurysm-stable at 4.6 cm.  Needs continued semiannual follow-up.  Hypertension-blood pressure markedly elevated today.  He says he was under stress getting to his appointment.  I really  do not want him going up to 160 even under stress some going to increase his Norvasc from 5 mg daily to 10 mg daily.  Continue his Hyzaar at current dose.  Obesity-emphasized the importance of weight loss.  Plan: Increase Norvasc to 10 mg daily Return in 6 months with CT angio of chest  I spent 20 minutes in review of records, images, and in consultation with Mr. Matt today Loreli Slot, MD [facility] [phone]

## 2023-08-21 ENCOUNTER — Telehealth: Payer: Self-pay

## 2023-08-21 ENCOUNTER — Ambulatory Visit: Payer: MEDICARE

## 2023-08-21 VITALS — Ht 73.0 in | Wt 348.0 lb

## 2023-08-21 DIAGNOSIS — Z0000 Encounter for general adult medical examination without abnormal findings: Secondary | ICD-10-CM

## 2023-08-21 NOTE — Telephone Encounter (Signed)
 Patient seen for AWV and is asking if a refill of Amlodipine  5mg  can be sent in for him.  He saw Dr. Kerrin who increased it to 10mg  but he does not want to take that dose.  Advised him that he would need an office visit to discuss this but he was adamant about asking first.

## 2023-08-21 NOTE — Patient Instructions (Addendum)
 [name] , Thank you for taking time out of your busy schedule to complete your Annual Wellness Visit with me. I enjoyed our conversation and look forward to speaking with you again next year. I, as well as your care team,  appreciate your ongoing commitment to your health goals. Please review the following plan we discussed and let me know if I can assist you in the future.  Your Game plan/ To Do List     Follow up Visits: Next Medicare AWV with our clinical staff: In 1 year    Have you seen your provider in the last 6 months (3 months if uncontrolled diabetes)? Yes Next Office Visit with your provider: To be scheduled   Clinician Recommendations:  Aim for 30 minutes of exercise or brisk walking, 6-8 glasses of water, and 5 servings of fruits and vegetables each day.       This is a list of the screening recommended for you and due dates:  Health Maintenance  Topic Date Due   Complete foot exam   Never done   Yearly kidney health urinalysis for diabetes  Never done   DTaP/Tdap/Td vaccine (1 - Tdap) Never done   Zoster (Shingles) Vaccine (1 of 2) Never done   Pneumococcal Vaccine for age over 3 (2 of 2 - PCV) 10/25/2021   COVID-19 Vaccine (3 - 2024-25 season) 10/07/2022   Hemoglobin A1C  04/15/2023   Flu Shot  09/06/2023   Yearly kidney function blood test for diabetes  10/16/2023   Eye exam for diabetics  01/25/2024   Medicare Annual Wellness Visit  08/20/2024   Colon Cancer Screening  07/08/2028   Hepatitis C Screening  Completed   Hepatitis B Vaccine  Aged Out   HPV Vaccine  Aged Out   Meningitis B Vaccine  Aged Out    Advanced directives: (ACP Link)Information on Advanced Care Planning can be found at [place]  Secretary of [facility] Advance Health Care Directives Advance Health Care Directives. [email or web address]   Advance Care Planning is important because it:  [x]  Makes sure you receive the medical care that is consistent with your values, goals, and preferences  [x]  It  provides guidance to your family and loved ones and reduces their decisional burden about whether or not they are making the right decisions based on your wishes.  Follow the link provided in your after visit summary or read over the paperwork we have mailed to you to help you started getting your Advance Directives in place. If you need assistance in completing these, please reach out to us  so that we can help you!  See attachments for Preventive Care and Fall Prevention Tips.

## 2023-08-21 NOTE — Progress Notes (Signed)
 Subjective:   Aaron Duncan is a 68 y.o. who presents for a Medicare Wellness preventive visit.  As a reminder, Annual Wellness Visits don't include a physical exam, and some assessments may be limited, especially if this visit is performed virtually. We may recommend an in-person follow-up visit with your provider if needed.  Visit Complete: Virtual I connected with  Cheryl Che on 08/21/23 by a audio enabled telemedicine application and verified that I am speaking with the correct person using two identifiers.  Patient Location: Home  Provider Location: Home Office  I discussed the limitations of evaluation and management by telemedicine. The patient expressed understanding and agreed to proceed.  Vital Signs: Because this visit was a virtual/telehealth visit, some criteria may be missing or patient reported. Any vitals not documented were not able to be obtained and vitals that have been documented are patient reported.  VideoDeclined- This patient declined Librarian, academic. Therefore the visit was completed with audio only.  Persons Participating in Visit: Patient.  AWV Questionnaire: No: Patient Medicare AWV questionnaire was not completed prior to this visit.  Cardiac Risk Factors include: advanced age (>56men, >28 women);diabetes mellitus;hypertension;male gender     Objective:    Today's Vitals   08/21/23 1401  Weight: (!) 348 lb (157.9 kg)  Height: 6' 1 (1.854 m)   Body mass index is 45.91 kg/m.     08/21/2023    2:22 PM 07/19/2022    4:42 PM 06/02/2020    3:20 PM  Advanced Directives  Does Patient Have a Medical Advance Directive? No No No  Would patient like information on creating a medical advance directive? Yes (MAU/Ambulatory/Procedural Areas - Information given) Yes (MAU/Ambulatory/Procedural Areas - Information given) Yes (MAU/Ambulatory/Procedural Areas - Information given)    Current Medications (verified) Outpatient Encounter  Medications as of 08/21/2023  Medication Sig   amLODipine  (NORVASC ) 10 MG tablet Take 1 tablet (10 mg total) by mouth daily.   cholecalciferol (VITAMIN D3) 25 MCG (1000 UNIT) tablet Take 1,000 Units by mouth daily.   Choline Dihydrogen Citrate (CHOLINE CITRATE PO) Take 3 tablets by mouth daily.   Cyanocobalamin (VITAMIN B 12 PO) Take 1 tablet by mouth daily.   Gamma-Aminobutyric Acid (GABA PO) Take by mouth.   Garlic 10 MG CAPS Take by mouth.   Ginger, Zingiber officinalis, (GINGER PO) Take by mouth.   Ginkgo Biloba 40 MG TABS Take by mouth.   losartan -hydrochlorothiazide (HYZAAR) 100-25 MG tablet Take 1 tablet by mouth daily.   magnesium 30 MG tablet Take 30 mg by mouth 2 (two) times daily.   Multiple Vitamin (MULTIVITAMIN WITH MINERALS) TABS tablet Take 1 tablet by mouth daily.   TURMERIC PO Take by mouth.   No facility-administered encounter medications on file as of 08/21/2023.    Allergies (verified) Patient has no known allergies.   History: Past Medical History:  Diagnosis Date   Ascending aortic aneurysm (HCC) 09/02/2020   4.6 cm by CT   DDD (degenerative disc disease), lumbosacral    Hepatic steatosis    Hyperlipidemia    Hypertension    Morbid obesity (HCC)    Sleep apnea    Past Surgical History:  Procedure Laterality Date   right elbow tendonitis     work related injury   SKIN CANCER EXCISION Right 2019   basal cell ca R side of face   Family History  Problem Relation Age of Onset   Diabetes Mother    AAA (abdominal aortic aneurysm) Father  Cancer Father        lung   Diabetes Maternal Uncle    AAA (abdominal aortic aneurysm) Paternal Grandfather    Colon polyps Brother    Colon cancer Neg Hx    Esophageal cancer Neg Hx    Rectal cancer Neg Hx    Stomach cancer Neg Hx    Social History   Socioeconomic History   Marital status: Married    Spouse name: Not on file   Number of children: Not on file   Years of education: Not on file   Highest  education level: 12th grade  Occupational History   Not on file  Tobacco Use   Smoking status: Never   Smokeless tobacco: Never  Vaping Use   Vaping status: Never Used  Substance and Sexual Activity   Alcohol use: Yes    Alcohol/week: 2.0 standard drinks of alcohol    Types: 2 Glasses of wine per week   Drug use: Never   Sexual activity: Not Currently  Other Topics Concern   Not on file  Social History Narrative   Not on file   Social Drivers of Health   Financial Resource Strain: Low Risk  (08/21/2023)   Overall Financial Resource Strain (CARDIA)    Difficulty of Paying Living Expenses: Not hard at all  Food Insecurity: No Food Insecurity (08/21/2023)   Hunger Vital Sign    Worried About Running Out of Food in the Last Year: Never true    Ran Out of Food in the Last Year: Never true  Transportation Needs: No Transportation Needs (08/21/2023)   PRAPARE - Administrator, Civil Service (Medical): No    Lack of Transportation (Non-Medical): No  Physical Activity: Inactive (08/21/2023)   Exercise Vital Sign    Days of Exercise per Week: 0 days    Minutes of Exercise per Session: 0 min  Stress: No Stress Concern Present (08/21/2023)   [employer] of Occupational Health - Occupational Stress Questionnaire    Feeling of Stress: Not at all  Social Connections: Moderately Isolated (08/21/2023)   Social Connection and Isolation Panel    Frequency of Communication with Friends and Family: More than three times a week    Frequency of Social Gatherings with Friends and Family: More than three times a week    Attends Religious Services: Patient declined    Database administrator or Organizations: No    Attends Engineer, structural: Never    Marital Status: Married    Tobacco Counseling Counseling given: Not Answered    Clinical Intake:  Pre-visit preparation completed: Yes  Pain : No/denies pain     Diabetes: Yes CBG done?: No Did pt. bring in CBG  monitor from home?: No  Lab Results  Component Value Date   HGBA1C 7.1 (H) 10/16/2022   HGBA1C 6.5 (H) 08/31/2021   HGBA1C 8.4 (H) 06/02/2020     How often do you need to have someone help you when you read instructions, pamphlets, or other written materials from your doctor or pharmacy?: 1 - Never  Interpreter Needed?: No  Information entered by :: Charmaine Bloodgood LPN   Activities of Daily Living     08/21/2023    2:03 PM  In your present state of health, do you have any difficulty performing the following activities:  Hearing? 0  Vision? 0  Difficulty concentrating or making decisions? 0  Walking or climbing stairs? 0  Dressing or bathing? 0  Doing errands,  shopping? 0  Preparing Food and eating ? N  Using the Toilet? N  In the past six months, have you accidently leaked urine? N  Do you have problems with loss of bowel control? N  Managing your Medications? N  Managing your Finances? N  Housekeeping or managing your Housekeeping? N    Patient Care Team: Duanne Butler DASEN, MD as PCP - General (Family Medicine) Kerrin Elspeth BROCKS, MD as Consulting Physician (Cardiothoracic Surgery)  I have updated your Care Teams any recent Medical Services you may have received from other providers in the past year.     Assessment:   This is a routine wellness examination for Aaron Duncan.  Hearing/Vision screen Hearing Screening - Comments:: Denies hearing difficulties  Vision Screening - Comments:: No vision problems; in office diabetic retina scan done 01/25/23     Goals Addressed             This Visit's Progress    Blood Pressure < 140/90         Depression Screen     08/21/2023    2:19 PM 10/16/2022    2:35 PM 07/19/2022    4:41 PM 06/02/2020    3:17 PM 10/14/2017    3:06 PM  PHQ 2/9 Scores  PHQ - 2 Score 0 0 0 0 1    Fall Risk     08/21/2023    2:23 PM 10/16/2022    2:35 PM 07/19/2022    4:40 PM 05/28/2022    8:30 AM 05/21/2022   11:05 AM  Fall Risk   Falls in  the past year? 0 0 0 0 0  Number falls in past yr: 0  0 0 0  Injury with Fall? 0  0 0 0  Risk for fall due to : No Fall Risks  No Fall Risks    Follow up Falls prevention discussed;Education provided;Falls evaluation completed  Falls prevention discussed;Education provided;Falls evaluation completed      MEDICARE RISK AT HOME:  Medicare Risk at Home Any stairs in or around the home?: No If so, are there any without handrails?: No Home free of loose throw rugs in walkways, pet beds, electrical cords, etc?: Yes Adequate lighting in your home to reduce risk of falls?: Yes Life alert?: No Use of a cane, walker or w/c?: No Grab bars in the bathroom?: Yes Shower chair or bench in shower?: No Elevated toilet seat or a handicapped toilet?: Yes  TIMED UP AND GO:  Was the test performed?  No  Cognitive Function: Declined/Normal: No cognitive concerns noted by patient or family. Patient alert, oriented, able to answer questions appropriately and recall recent events. No signs of memory loss or confusion.        07/19/2022    4:42 PM  6CIT Screen  What Year? 0 points  What month? 0 points  What time? 0 points  Count back from 20 0 points  Months in reverse 0 points  Repeat phrase 0 points  Total Score 0 points    Immunizations Immunization History  Administered Date(s) Administered   Fluad Quad(high Dose 65+) 10/25/2020   Influenza-Unspecified 12/12/2021   Moderna Sars-Covid-2 Vaccination 09/17/2019, 10/15/2019   Pneumococcal Polysaccharide-23 10/25/2020    Screening Tests Health Maintenance  Topic Date Due   FOOT EXAM  Never done   Diabetic kidney evaluation - Urine ACR  Never done   DTaP/Tdap/Td (1 - Tdap) Never done   Zoster Vaccines- Shingrix (1 of 2) Never done   Pneumococcal  Vaccine: 50+ Years (2 of 2 - PCV) 10/25/2021   COVID-19 Vaccine (3 - 2024-25 season) 10/07/2022   HEMOGLOBIN A1C  04/15/2023   INFLUENZA VACCINE  09/06/2023   Diabetic kidney evaluation - eGFR  measurement  10/16/2023   OPHTHALMOLOGY EXAM  01/25/2024   Medicare Annual Wellness (AWV)  08/20/2024   Colonoscopy  07/08/2028   Hepatitis C Screening  Completed   Hepatitis B Vaccines  Aged Out   HPV VACCINES  Aged Out   Meningococcal B Vaccine  Aged Out    Health Maintenance  Health Maintenance Due  Topic Date Due   FOOT EXAM  Never done   Diabetic kidney evaluation - Urine ACR  Never done   DTaP/Tdap/Td (1 - Tdap) Never done   Zoster Vaccines- Shingrix (1 of 2) Never done   Pneumococcal Vaccine: 50+ Years (2 of 2 - PCV) 10/25/2021   COVID-19 Vaccine (3 - 2024-25 season) 10/07/2022   HEMOGLOBIN A1C  04/15/2023   Health Maintenance Items Addressed: Declines vaccines at this time   Additional Screening:  Vision Screening: Recommended annual ophthalmology exams for early detection of glaucoma and other disorders of the eye. Would you like a referral to an eye doctor? No    Dental Screening: Recommended annual dental exams for proper oral hygiene  Community Resource Referral / Chronic Care Management: CRR required this visit?  No   CCM required this visit?  No   Plan:    I have personally reviewed and noted the following in the patient's chart:   Medical and social history Use of alcohol, tobacco or illicit drugs  Current medications and supplements including opioid prescriptions. Patient is not currently taking opioid prescriptions. Functional ability and status Nutritional status Physical activity Advanced directives List of other physicians Hospitalizations, surgeries, and ER visits in previous 12 months Vitals Screenings to include cognitive, depression, and falls Referrals and appointments  In addition, I have reviewed and discussed with patient certain preventive protocols, quality metrics, and best practice recommendations. A written personalized care plan for preventive services as well as general preventive health recommendations were provided to  patient.   Lavelle Pfeiffer [place], [place]   2/83/7974   After Visit Summary: (MyChart) Due to this being a telephonic visit, the after visit summary with patients personalized plan was offered to patient via MyChart   Notes: See telephone note

## 2023-08-22 ENCOUNTER — Other Ambulatory Visit: Payer: Self-pay

## 2023-08-22 DIAGNOSIS — I10 Essential (primary) hypertension: Secondary | ICD-10-CM

## 2023-08-22 MED ORDER — AMLODIPINE BESYLATE 5 MG PO TABS: 5.0000 mg | ORAL_TABLET | Freq: Every day | ORAL | 0 refills | Status: DC

## 2023-09-12 ENCOUNTER — Other Ambulatory Visit: Payer: Self-pay | Admitting: Thoracic Surgery (Cardiothoracic Vascular Surgery)

## 2023-09-12 DIAGNOSIS — I7121 Aneurysm of the ascending aorta, without rupture: Secondary | ICD-10-CM

## 2023-10-08 ENCOUNTER — Ambulatory Visit (HOSPITAL_COMMUNITY): Payer: MEDICARE

## 2023-10-08 ENCOUNTER — Other Ambulatory Visit: Payer: Self-pay | Admitting: Thoracic Surgery (Cardiothoracic Vascular Surgery)

## 2023-10-08 DIAGNOSIS — I7121 Aneurysm of the ascending aorta, without rupture: Secondary | ICD-10-CM

## 2023-10-10 ENCOUNTER — Ambulatory Visit (HOSPITAL_COMMUNITY)
Admission: RE | Admit: 2023-10-10 | Discharge: 2023-10-10 | Disposition: A | Discharge status: Home / Self Care | Payer: MEDICARE | Source: Ambulatory Visit | Attending: Thoracic Surgery (Cardiothoracic Vascular Surgery) | Admitting: Thoracic Surgery (Cardiothoracic Vascular Surgery)

## 2023-10-10 DIAGNOSIS — I7121 Aneurysm of the ascending aorta, without rupture: Secondary | ICD-10-CM | POA: Diagnosis present

## 2023-10-15 ENCOUNTER — Ambulatory Visit: Payer: MEDICARE

## 2023-10-15 ENCOUNTER — Other Ambulatory Visit: Payer: Self-pay | Admitting: Thoracic Surgery (Cardiothoracic Vascular Surgery)

## 2023-10-15 VITALS — BP 139/94 | HR 81 | Resp 18 | Ht 73.0 in

## 2023-10-15 DIAGNOSIS — I7121 Aneurysm of the ascending aorta, without rupture: Secondary | ICD-10-CM | POA: Insufficient documentation

## 2023-10-15 DIAGNOSIS — N2889 Other specified disorders of kidney and ureter: Secondary | ICD-10-CM

## 2023-10-15 DIAGNOSIS — N289 Disorder of kidney and ureter, unspecified: Secondary | ICD-10-CM | POA: Insufficient documentation

## 2023-10-15 NOTE — Patient Instructions (Addendum)

## 2023-10-15 NOTE — Progress Notes (Signed)
 [address] [place] [address]             [phone]            Aaron Duncan [id number] 1955-10-07   History of Present Illness:  [name] is a 68 year old man with medical history of obesity, hypertension, hyperlipidemia, sleep apnea, type 2 diabetes, and degenerative disc disease who presents for continued surveillance of ascending thoracic aortic aneurysm.  He has been followed by our clinic since 2022 and aneurysm has stayed stable in size at 4.5 cm - 4.7 cm. On recent CT of chest without contrast aneurysm measured 4.7 cm.  Echocardiogram from 07/2020 showed that the aortic valve is tricuspid.    He reports that he has been doing well.  His blood pressure is controlled with current medication therapy.  He does check his BP at home and has readings in the 120s-130s/80s. He does body squats for exercise since cardio is hard due to his degenerative disc disease.  He denies chest pain, shortness of breath and lower leg swelling.    Father and grandfather had AAA Current Outpatient Medications on File Prior to Visit  Medication Sig Dispense Refill   amLODipine  (NORVASC ) 5 MG tablet Take 1 tablet (5 mg total) by mouth daily. 30 tablet 0   Biotin 1 MG CAPS Take by mouth.     cholecalciferol (VITAMIN D3) 25 MCG (1000 UNIT) tablet Take 1,000 Units by mouth daily.     Choline Dihydrogen Citrate (CHOLINE CITRATE PO) Take 3 tablets by mouth daily.     co-enzyme Q-10 30 MG capsule Take 30 mg by mouth 3 (three) times daily.     Cyanocobalamin (VITAMIN B 12 PO) Take 1 tablet by mouth daily.     Gamma-Aminobutyric Acid (GABA PO) Take by mouth.     Garlic 10 MG CAPS Take by mouth.     Ginger, Zingiber officinalis, (GINGER PO) Take by mouth.     Ginkgo Biloba 40 MG TABS Take by mouth.     losartan -hydrochlorothiazide (HYZAAR) 100-25 MG tablet Take 1 tablet by mouth daily. 90 tablet 3   magnesium 30 MG tablet Take 30 mg by mouth 2 (two) times daily.      Misc Natural Products (BEET ROOT PO) Take by mouth.     MISC NATURAL PRODUCTS PO Take by mouth. Green tea capsules     MISC NATURAL PRODUCTS PO Take by mouth. Saw Palmeto     Multiple Vitamin (MULTIVITAMIN WITH MINERALS) TABS tablet Take 1 tablet by mouth daily.     TURMERIC PO Take by mouth.     amLODipine  (NORVASC ) 10 MG tablet Take 1 tablet (10 mg total) by mouth daily. (Patient not taking: Reported on 10/15/2023) 30 tablet 6   No current facility-administered medications on file prior to visit.     ROS: Review of Systems  Constitutional:  Negative for malaise/fatigue.  Respiratory:  Negative for cough, shortness of breath and wheezing.   Cardiovascular:  Negative for chest pain and leg swelling.  Musculoskeletal:  Positive for back pain.  Neurological:  Negative for headaches.     BP (!) 139/94   Pulse 81   Resp 18   Ht 6' 1 (1.854 m)   SpO2 96%   BMI 45.91 kg/m   Physical Exam Constitutional:      Appearance: Normal appearance.  HENT:     Head: Normocephalic and atraumatic.  Cardiovascular:     Rate and Rhythm: Normal rate and regular rhythm.     Heart sounds: Normal heart sounds, S1 normal and S2 normal.  Pulmonary:     Effort: Pulmonary effort is normal.     Breath sounds: Normal breath sounds.  Skin:    General: Skin is warm and dry.  Neurological:     General: No focal deficit present.     Mental Status: He is alert and oriented to person, place, and time.      Imaging: CLINICAL DATA:  Thoracic aorta disease, pre-op planning TAA   EXAM: CT CHEST WITHOUT CONTRAST   TECHNIQUE: Multidetector CT imaging of the chest was performed following the standard protocol without IV contrast.   RADIATION DOSE REDUCTION: This exam was performed according to the departmental dose-optimization program which includes automated exposure control, adjustment of the mA and/or kV according to patient size and/or use of iterative reconstruction technique.   COMPARISON:   04/09/2023   FINDINGS: Cardiovascular: Mild aneurysmal dilatation of the ascending thoracic aorta measuring 4.7 cm compared to 4.5 cm previously. Scattered aortic and coronary artery calcifications. Heart is normal size.   Mediastinum/Nodes: No mediastinal, hilar, or axillary adenopathy. Trachea and esophagus are unremarkable. Thyroid unremarkable.   Lungs/Pleura: Areas of scarring in the lower lobes bilaterally. No confluent opacities or effusions. No pneumothorax.   Upper Abdomen: Scattered hepatic cysts. Exophytic nodule off the superior pole of the right kidney is partially visualized and measures 1.7 cm. This was not present on prior abdominal CT from 2019. Recommend further workup.   Musculoskeletal: Chest wall soft tissues are unremarkable. No acute bony abnormality.   IMPRESSION: Ascending thoracic aortic aneurysm with maximum aortic diameter of 4.7 cm.   Coronary artery disease.   No acute cardiopulmonary disease.   1.7 cm exophytic lesion off the superior pole of the right kidney is partially visualized and incompletely evaluated. This does not appear to reflect a simple cyst. Recommend further evaluation with contrast-enhanced CT or MRI.   Aortic Atherosclerosis (ICD10-I70.0).     Electronically Signed   By: Franky Crease M.D.   On: 10/10/2023 12:08     A/P:  Aneurysm of ascending aorta without rupture (HCC) -4.7 cm ascending thoracic aortic aneurysm on CT of chest without contrast. We discussed the natural history and and risk factors for growth of ascending aortic aneurysms. Discussed recommendations to minimize the risk of further expansion or dissection including careful blood pressure control, avoidance of contact sports and heavy lifting, attention to lipid management.  We covered the importance of staying never user of tobacco.  The patient does not yet meet surgical criteria of >5.5cm. The patient is aware of signs and symptoms of aortic dissection and  when to present to the emergency department.    -Follow up in 6 months with CTA of chest for continued surveillance    Lesion of right native kidney - 1.7 cm exophytic lesion off the superior pole of the right kidney is partially visualized and incompletely evaluated. This does not appear to reflect a simple cyst. Recommend further evaluation with contrast-enhanced CT or MRI. -Ordered CT of abdomen for further evaluation of kidney lesion -Will call patient results after CT is obtained with further steps  Risk Modification:  Statin:  not currently prescribed  Smoking cessation instruction/counseling given: never user  Patient was counseled on importance of Blood Pressure Control  They are instructed to contact their Primary Care Physician if they start to have blood pressure readings over  130s/90s. Do not ever stop blood pressure medications on your own, unless instructed by healthcare professional.  Please avoid use of Fluoroquinolones as this can potentially increase your risk of Aortic Rupture and/or Dissection  Patient educated on signs and symptoms of Aortic Dissection, handout also provided in AVS  Manuelita CHRISTELLA Rough, PA-C 10/15/23

## 2023-10-16 ENCOUNTER — Ambulatory Visit: Payer: MEDICARE

## 2023-11-13 ENCOUNTER — Ambulatory Visit (HOSPITAL_COMMUNITY)
Admission: RE | Admit: 2023-11-13 | Discharge: 2023-11-13 | Disposition: A | Discharge status: Home / Self Care | Payer: MEDICARE | Source: Ambulatory Visit | Attending: Thoracic Surgery (Cardiothoracic Vascular Surgery) | Admitting: Thoracic Surgery (Cardiothoracic Vascular Surgery)

## 2023-11-13 DIAGNOSIS — N2889 Other specified disorders of kidney and ureter: Secondary | ICD-10-CM | POA: Diagnosis present

## 2023-11-13 MED ORDER — IOHEXOL 350 MG/ML SOLN
100.0000 mL | Freq: Once | INTRAVENOUS | Status: AC | PRN
Start: 2023-11-13 — End: 2023-11-13
  Administered 2023-11-13: 100 mL via INTRAVENOUS

## 2023-11-20 ENCOUNTER — Other Ambulatory Visit: Payer: Self-pay | Admitting: Family Medicine

## 2023-11-20 DIAGNOSIS — I10 Essential (primary) hypertension: Secondary | ICD-10-CM

## 2023-11-26 ENCOUNTER — Ambulatory Visit (INDEPENDENT_AMBULATORY_CARE_PROVIDER_SITE_OTHER): Payer: MEDICARE

## 2023-11-26 DIAGNOSIS — Z23 Encounter for immunization: Secondary | ICD-10-CM | POA: Diagnosis not present

## 2023-11-26 NOTE — Progress Notes (Signed)
 Patient is in office today for a nurse visit for Immunization. Patient Injection was given in the  Left deltoid. Patient tolerated injection well.

## 2023-11-29 ENCOUNTER — Other Ambulatory Visit: Payer: Self-pay | Admitting: Family Medicine

## 2023-12-24 ENCOUNTER — Ambulatory Visit: Payer: MEDICARE | Admitting: Family Medicine

## 2024-01-07 ENCOUNTER — Ambulatory Visit: Payer: MEDICARE | Admitting: Family Medicine

## 2024-01-07 ENCOUNTER — Encounter: Payer: Self-pay | Admitting: Family Medicine

## 2024-01-07 VITALS — BP 146/86 | HR 98 | Temp 98.5°F | Ht 73.0 in | Wt 341.8 lb

## 2024-01-07 DIAGNOSIS — E1159 Type 2 diabetes mellitus with other circulatory complications: Secondary | ICD-10-CM

## 2024-01-07 DIAGNOSIS — I152 Hypertension secondary to endocrine disorders: Secondary | ICD-10-CM

## 2024-01-07 DIAGNOSIS — I10 Essential (primary) hypertension: Secondary | ICD-10-CM

## 2024-01-07 DIAGNOSIS — L989 Disorder of the skin and subcutaneous tissue, unspecified: Secondary | ICD-10-CM

## 2024-01-07 MED ORDER — LOSARTAN POTASSIUM 100 MG PO TABS: 100.0000 mg | ORAL_TABLET | Freq: Every day | ORAL | 3 refills | Status: AC

## 2024-01-07 MED ORDER — AMLODIPINE BESYLATE 10 MG PO TABS: 10.0000 mg | ORAL_TABLET | Freq: Every day | ORAL | 3 refills | Status: AC

## 2024-01-07 NOTE — Progress Notes (Signed)
 Subjective:    Patient ID: Aaron Duncan, male    DOB: 1955/04/11, 68 y.o.   MRN: [id number]  HPI  Patient has not been seen in over a year.  Patient has type 2 diabetes mellitus.  He has refused treatment options in the past.  He is long overdue for repeat with lab work.  Patient is concerned due to a skin lesion developing below his left ear.  There is an erythematous scaly papule that is roughly 1 cm in diameter.  He is concerned that this is skin cancer.  Based on the research that he has conducted, he is convinced that hydrochlorothiazide increases his risk of skin cancer.  He wants to stop the hydrochlorothiazide.  I explained to the patient he has an ascending aortic aneurysm and we want to keep his blood pressure under control.  His blood pressure today is elevated at 146/86.  He states that he has not been taking amlodipine  10 mg a day.  He has been taking losartan  hydrochlorothiazide however he wants to stop the hydrochlorothiazide Past Medical History:  Diagnosis Date   Ascending aortic aneurysm 09/02/2020   4.6 cm by CT   DDD (degenerative disc disease), lumbosacral    Hepatic steatosis    Hyperlipidemia    Hypertension    Morbid obesity (HCC)    Sleep apnea    Past Surgical History:  Procedure Laterality Date   right elbow tendonitis     work related injury   SKIN CANCER EXCISION Right 2019   basal cell ca R side of face    Current Outpatient Medications on File Prior to Visit  Medication Sig Dispense Refill   Biotin 1 MG CAPS Take by mouth.     cholecalciferol (VITAMIN D3) 25 MCG (1000 UNIT) tablet Take 1,000 Units by mouth daily.     Choline Dihydrogen Citrate (CHOLINE CITRATE PO) Take 3 tablets by mouth daily.     co-enzyme Q-10 30 MG capsule Take 30 mg by mouth 3 (three) times daily.     Cyanocobalamin (VITAMIN B 12 PO) Take 1 tablet by mouth daily.     Gamma-Aminobutyric Acid (GABA PO) Take by mouth.     Garlic 10 MG CAPS Take by mouth.     Ginger, Zingiber  officinalis, (GINGER PO) Take by mouth.     Ginkgo Biloba 40 MG TABS Take by mouth.     magnesium 30 MG tablet Take 30 mg by mouth 2 (two) times daily.     Misc Natural Products (BEET ROOT PO) Take by mouth.     MISC NATURAL PRODUCTS PO Take by mouth. Green tea capsules     MISC NATURAL PRODUCTS PO Take by mouth. Saw Palmeto     Multiple Vitamin (MULTIVITAMIN WITH MINERALS) TABS tablet Take 1 tablet by mouth daily.     TURMERIC PO Take by mouth.     No current facility-administered medications on file prior to visit.    Allergies  Allergen Reactions   Quinolones Other (See Comments)    Ascending thoracic aortic aneruysm   Social History   Socioeconomic History   Marital status: Married    Spouse name: Not on file   Number of children: Not on file   Years of education: Not on file   Highest education level: 12th grade  Occupational History   Not on file  Tobacco Use   Smoking status: Never   Smokeless tobacco: Never  Vaping Use   Vaping status: Never Used  Substance  and Sexual Activity   Alcohol use: Yes    Alcohol/week: 2.0 standard drinks of alcohol    Types: 2 Glasses of wine per week   Drug use: Never   Sexual activity: Not Currently  Other Topics Concern   Not on file  Social History Narrative   Not on file   Social Drivers of Health   Financial Resource Strain: Low Risk  (08/21/2023)   Overall Financial Resource Strain (CARDIA)    Difficulty of Paying Living Expenses: Not hard at all  Food Insecurity: No Food Insecurity (08/21/2023)   Hunger Vital Sign    Worried About Running Out of Food in the Last Year: Never true    Ran Out of Food in the Last Year: Never true  Transportation Needs: No Transportation Needs (08/21/2023)   PRAPARE - Administrator, Civil Service (Medical): No    Lack of Transportation (Non-Medical): No  Physical Activity: Inactive (08/21/2023)   Exercise Vital Sign    Days of Exercise per Week: 0 days    Minutes of Exercise per  Session: 0 min  Stress: No Stress Concern Present (08/21/2023)   [employer] of Occupational Health - Occupational Stress Questionnaire    Feeling of Stress: Not at all  Social Connections: Moderately Isolated (08/21/2023)   Social Connection and Isolation Panel    Frequency of Communication with Friends and Family: More than three times a week    Frequency of Social Gatherings with Friends and Family: More than three times a week    Attends Religious Services: Patient declined    Database Administrator or Organizations: No    Attends Banker Meetings: Never    Marital Status: Married  Catering Manager Violence: Not At Risk (08/21/2023)   Humiliation, Afraid, Rape, and Kick questionnaire    Fear of Current or Ex-Partner: No    Emotionally Abused: No    Physically Abused: No    Sexually Abused: No  Patient is a retired financial controller Family History  Problem Relation Age of Onset   Diabetes Mother    AAA (abdominal aortic aneurysm) Father    Cancer Father        lung   Diabetes Maternal Uncle    AAA (abdominal aortic aneurysm) Paternal Grandfather    Colon polyps Brother    Colon cancer Neg Hx    Esophageal cancer Neg Hx    Rectal cancer Neg Hx    Stomach cancer Neg Hx       Review of Systems     Objective:   Physical Exam Constitutional:      Appearance: He is obese.  Cardiovascular:     Rate and Rhythm: Normal rate and regular rhythm.     Heart sounds: Normal heart sounds.  Pulmonary:     Effort: Pulmonary effort is normal.     Breath sounds: Normal breath sounds.  Skin:    Findings: No erythema or rash.  Neurological:     Mental Status: He is alert.         Assessment & Plan:  Hypertension associated with type 2 diabetes mellitus (HCC) - Plan: Hemoglobin A1c, Comprehensive metabolic panel with GFR, Lipid panel, Microalbumin / creatinine urine ratio  Skin lesion of neck - Plan: Pathology  Benign essential HTN  Patient has a 1 cm round  scaly erythematous papule below his left ear that has been there for 6 months.  I believe that this is likely an irritated seborrheic keratosis versus  a wart.  Patient would like this excised and biopsy.  I numbed the area with 0.1% lidocaine with epinephrine.  I performed a shave biopsy of the lesion in its entirety down to the underlying dermis.  I then applied Drysol to achieve hemostasis.  The lesion was sent to pathology in a labeled container.  Patient does not want to take hydrochlorothiazide.  Therefore I will switch him to losartan  100 mg a day and I will increase his amlodipine  to 10 mg a day.  I strongly emphasized that he needs to keep his blood pressure under 140/90 to help prevent the aneurysm from growing.  While the patient is here today I will check a hemoglobin A1c to monitor the management of his diabetes along with a urine microalbumin to creatinine ratio and a lipid panel

## 2024-01-08 NOTE — Addendum Note (Signed)
 Addended by: ANGELENA RONAL BRADLEY K on: 01/08/2024 11:14 AM   Modules accepted: Orders

## 2024-01-09 ENCOUNTER — Ambulatory Visit: Payer: Self-pay | Admitting: Family Medicine

## 2024-01-09 DIAGNOSIS — L989 Disorder of the skin and subcutaneous tissue, unspecified: Secondary | ICD-10-CM

## 2024-01-09 LAB — PATHOLOGY REPORT

## 2024-01-09 LAB — MICROALBUMIN / CREATININE URINE RATIO
Creatinine, Urine: 119 mg/dL (ref 20–320)
Microalb Creat Ratio: 43 mg/g{creat} — ABNORMAL HIGH (ref <30)
Microalb, Ur: 5.1 mg/dL

## 2024-01-09 LAB — TISSUE SPECIMEN

## 2024-01-10 LAB — MICROALBUMIN / CREATININE URINE RATIO

## 2024-01-10 LAB — COMPREHENSIVE METABOLIC PANEL WITH GFR
AG Ratio: 1.7 (calc) (ref 1.0–2.5)
ALT: 25 U/L (ref 9–46)
AST: 23 U/L (ref 10–35)
Albumin: 4.5 g/dL (ref 3.6–5.1)
Alkaline phosphatase (APISO): 70 U/L (ref 35–144)
BUN: 20 mg/dL (ref 7–25)
CO2: 24 mmol/L (ref 20–32)
Calcium: 9.9 mg/dL (ref 8.6–10.3)
Chloride: 102 mmol/L (ref 98–110)
Creat: 0.86 mg/dL (ref 0.70–1.35)
Globulin: 2.7 g/dL (ref 1.9–3.7)
Glucose, Bld: 114 mg/dL — ABNORMAL HIGH (ref 65–99)
Potassium: 3.5 mmol/L (ref 3.5–5.3)
Sodium: 138 mmol/L (ref 135–146)
Total Bilirubin: 0.9 mg/dL (ref 0.2–1.2)
Total Protein: 7.2 g/dL (ref 6.1–8.1)
eGFR: 94 mL/min/1.73m2 (ref >=60)

## 2024-01-10 LAB — LIPID PANEL
Cholesterol: 180 mg/dL (ref <200)
HDL: 38 mg/dL — ABNORMAL LOW (ref >=40)
LDL Cholesterol (Calc): 117 mg/dL — ABNORMAL HIGH
Non-HDL Cholesterol (Calc): 142 mg/dL — ABNORMAL HIGH (ref <130)
Total CHOL/HDL Ratio: 4.7 (calc) (ref <5.0)
Triglycerides: 139 mg/dL (ref <150)

## 2024-01-10 LAB — HEMOGLOBIN A1C
Hgb A1c MFr Bld: 6.4 % — ABNORMAL HIGH (ref <5.7)
Mean Plasma Glucose: 137 mg/dL
eAG (mmol/L): 7.6 mmol/L

## 2024-01-16 IMAGING — CT CT CHEST W/O CM
2 of 5 series · 15 of 36 positions shown, 18 images · non-contrast
Comparison: CT 09/02/2020

CLINICAL DATA: Follow-up aortic aneurysm.



[Series 4: chest 2.00 br40 s3 · coronal · 0.76mm/px · 3 of 229 slices shown]
[im 46/229  lung]
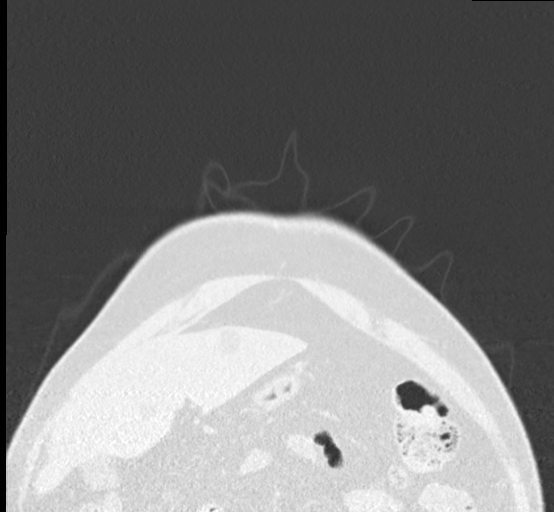
[im 92/229  lung]
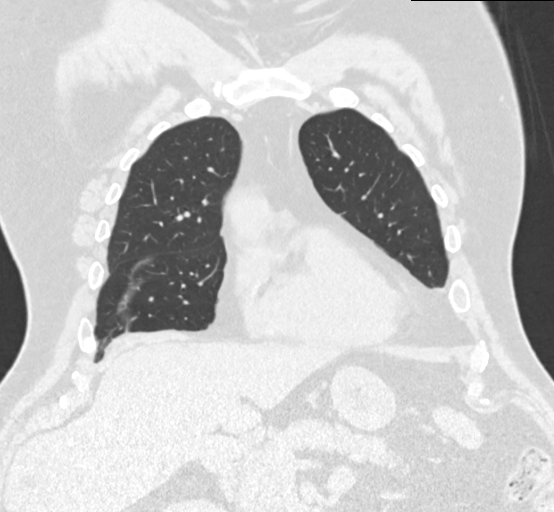
[im 137/229  lung]
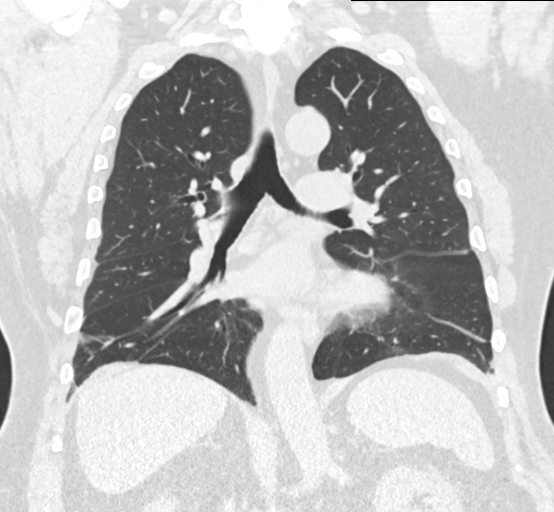

[Series 10: chest 1.00 br40 s3 super d · axial · 0.91mm/px · z∈[+1531,+1867]mm · 12 of 485 slices shown, 15 images]
[im 33/485  mediastinal]
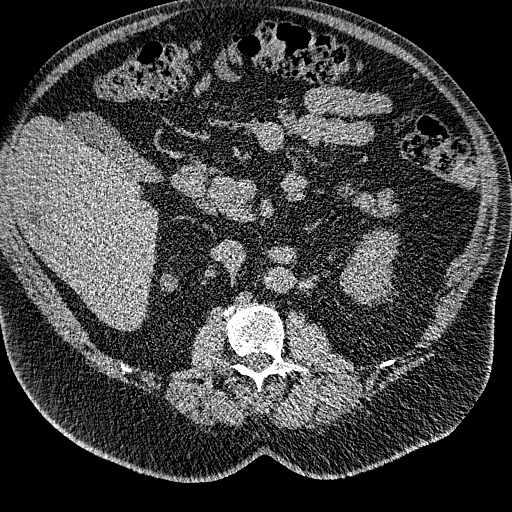
[im 33/485  lung]
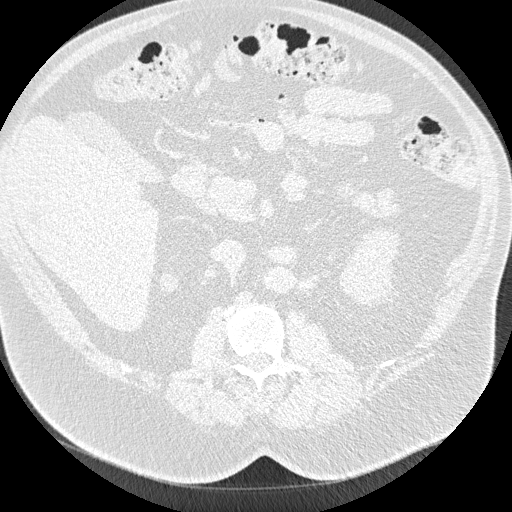
[im 65/485  lung]
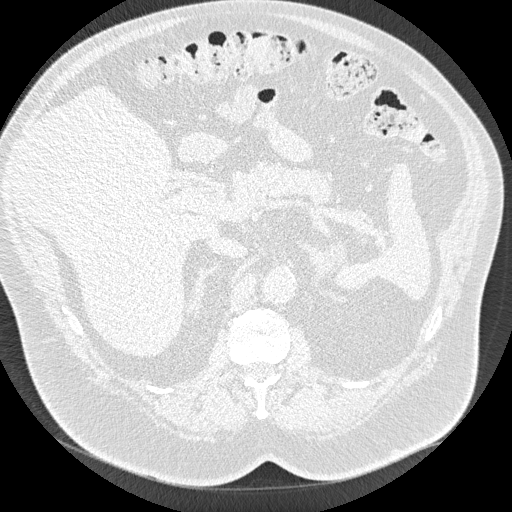
[im 97/485  lung]
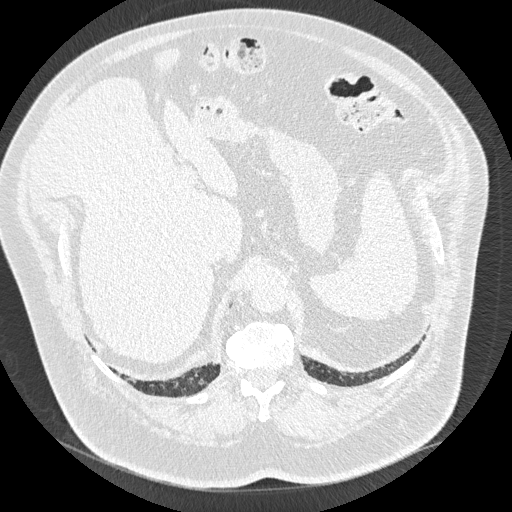
[im 162/485  lung]
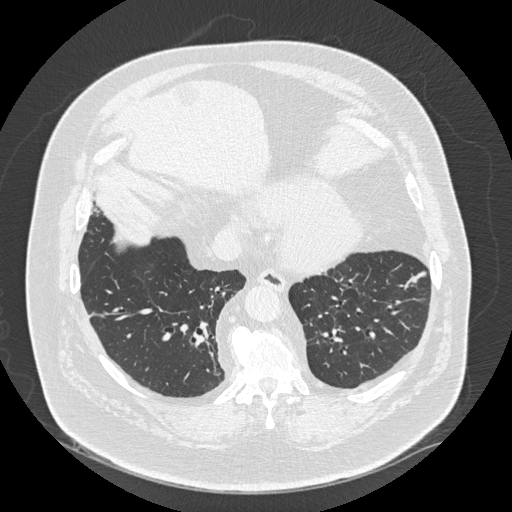
[im 194/485  mediastinal]
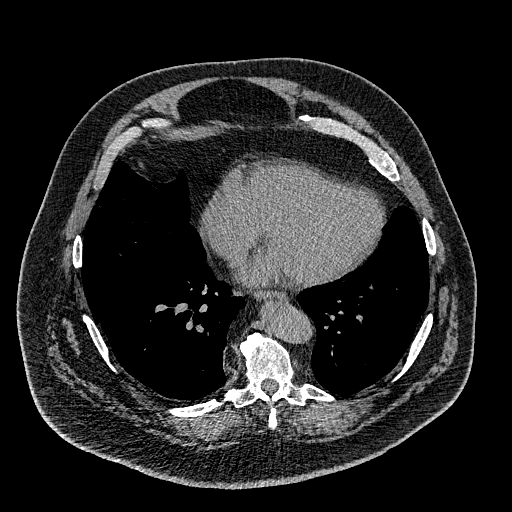
[im 194/485  lung]
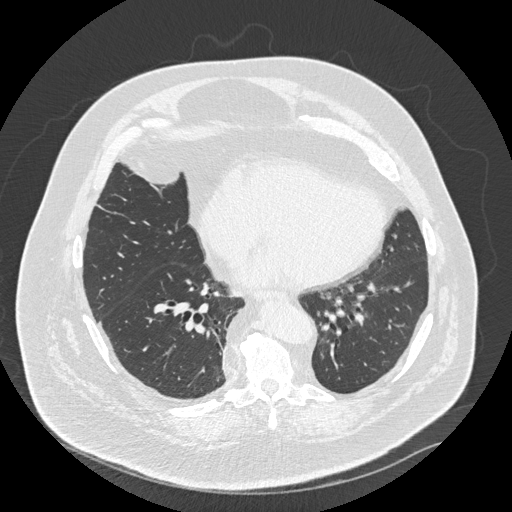
[im 226/485  lung]
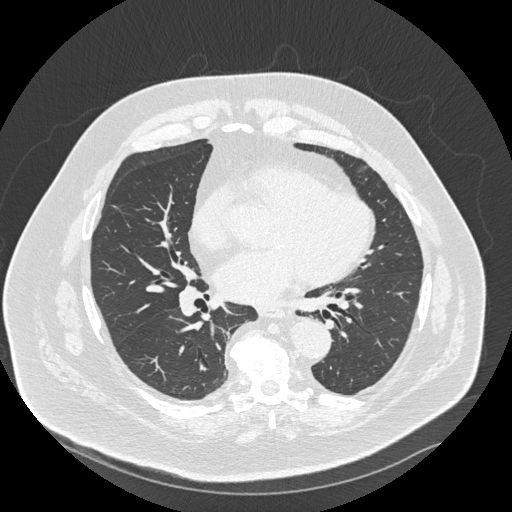
[im 259/485  lung]
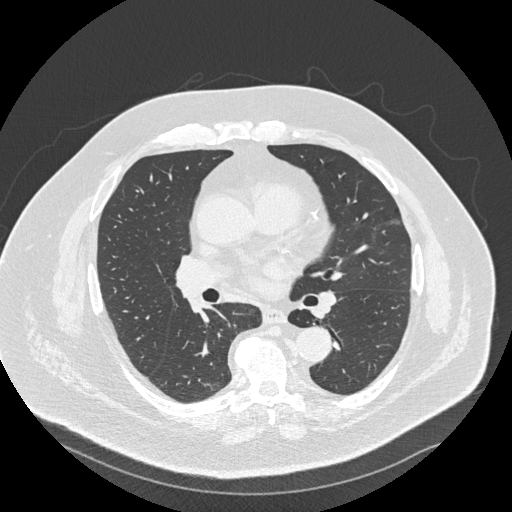
[im 291/485  lung]
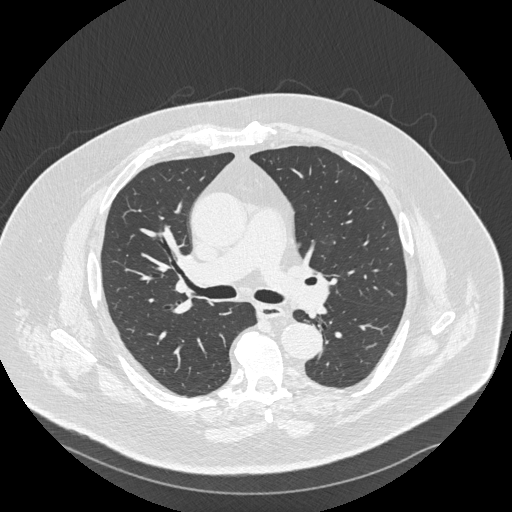
[im 323/485  mediastinal]
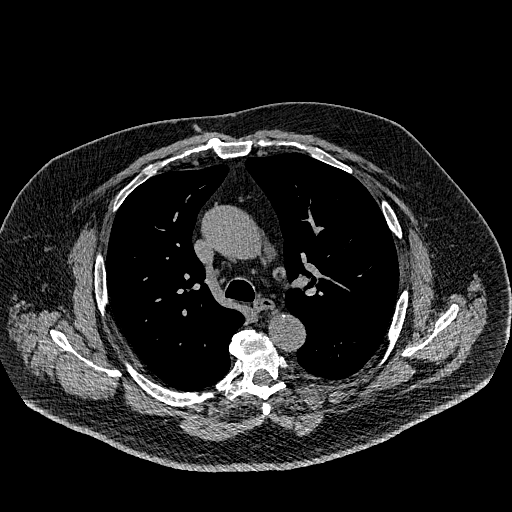
[im 323/485  lung]
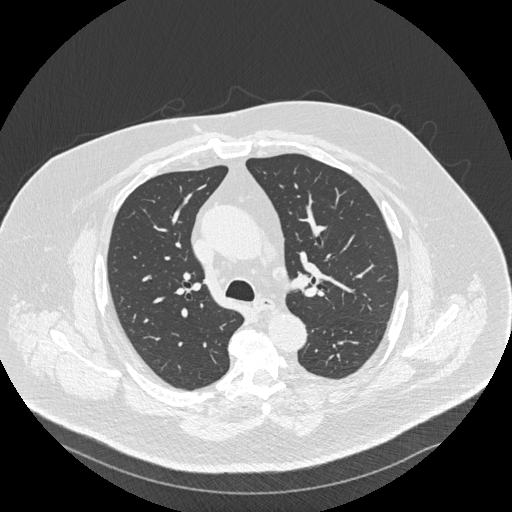
[im 388/485  lung]
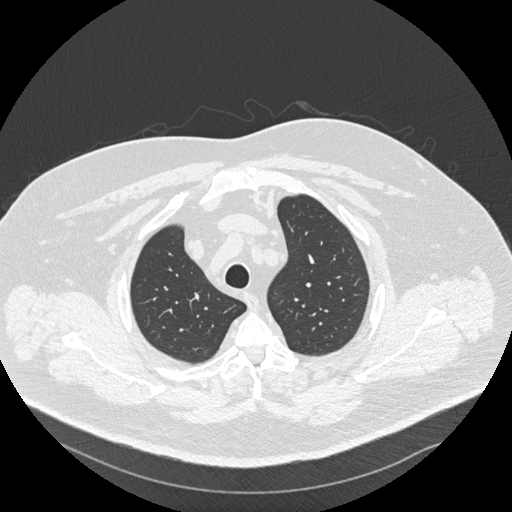
[im 420/485  lung]
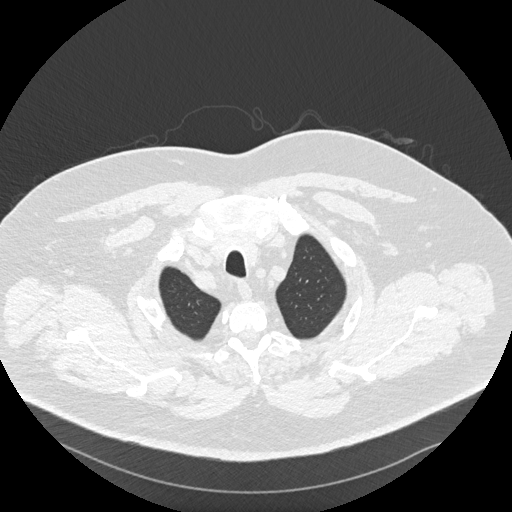
[im 452/485  lung]
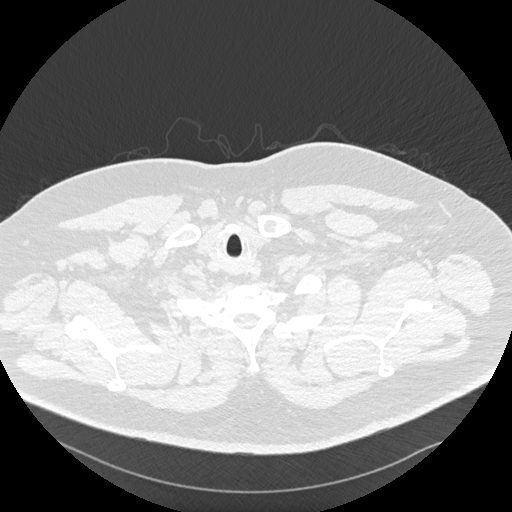

[15 of 36 positions shown; findings below may reference images not displayed]

FINDINGS: Cardiovascular: The ascending thoracic aorta measures 4.7 x 4.7 cm
(image 109/series 10/axial) compared to 4.6 x 4.4 cm on comparison
exam. Aorta measured at the level of the pulmonary outflow tract
matching comparison.

Aorta measures 4.5 cm in oblique sagittal plane.

Coronary artery calcification and aortic atherosclerotic
calcification.

Mediastinum/Nodes: No axillary or supraclavicular adenopathy. No
mediastinal or hilar adenopathy. No pericardial fluid. Esophagus
normal.

Lungs/Pleura: No suspicious pulmonary nodules. Normal pleural.
Airways normal.

Upper Abdomen: Benign hepatic cysts.  Adrenal glands normal.

Musculoskeletal: No aggressive osseous lesion. Degenerative
osteophytosis of the spine.
IMPRESSION: 1. Measured in same orientation as comparison exam ascending
thoracic aorta measures 4.7 cm.
2. Coronary artery calcification and Aortic Atherosclerosis
([id number]).

## 2024-02-05 ENCOUNTER — Encounter: Payer: Self-pay | Admitting: Family Medicine

## 2024-02-07 ENCOUNTER — Other Ambulatory Visit: Payer: Self-pay | Admitting: Family Medicine

## 2024-02-07 DIAGNOSIS — N2889 Other specified disorders of kidney and ureter: Secondary | ICD-10-CM

## 2024-08-26 ENCOUNTER — Ambulatory Visit: Payer: MEDICARE
